# Patient Record
Sex: Female | Born: 1980 | Hispanic: No | Marital: Married | Smoking: Never smoker
Health system: Southern US, Community
[De-identification: ages and names within clinical notes are randomized; demographics above are authoritative.]

## PROBLEM LIST (undated history)

## (undated) ENCOUNTER — Inpatient Hospital Stay (HOSPITAL_COMMUNITY): Payer: Self-pay

## (undated) DIAGNOSIS — Z789 Other specified health status: Secondary | ICD-10-CM

## (undated) HISTORY — PX: THERAPEUTIC ABORTION: SHX798

## (undated) HISTORY — PX: NO PAST SURGERIES: SHX2092

---

## 2016-05-19 ENCOUNTER — Inpatient Hospital Stay (HOSPITAL_COMMUNITY)
Admission: AD | Admit: 2016-05-19 | Discharge: 2016-05-19 | Disposition: A | Discharge status: Home / Self Care | Payer: BLUE CROSS/BLUE SHIELD | Source: Ambulatory Visit | Attending: Obstetrics & Gynecology | Admitting: Obstetrics & Gynecology

## 2016-05-19 ENCOUNTER — Encounter (HOSPITAL_COMMUNITY): Payer: Self-pay | Admitting: *Deleted

## 2016-05-19 DIAGNOSIS — Z3A01 Less than 8 weeks gestation of pregnancy: Secondary | ICD-10-CM | POA: Insufficient documentation

## 2016-05-19 DIAGNOSIS — O219 Vomiting of pregnancy, unspecified: Secondary | ICD-10-CM | POA: Insufficient documentation

## 2016-05-19 DIAGNOSIS — O9989 Other specified diseases and conditions complicating pregnancy, childbirth and the puerperium: Secondary | ICD-10-CM

## 2016-05-19 DIAGNOSIS — K5900 Constipation, unspecified: Secondary | ICD-10-CM | POA: Insufficient documentation

## 2016-05-19 DIAGNOSIS — R109 Unspecified abdominal pain: Secondary | ICD-10-CM

## 2016-05-19 DIAGNOSIS — O26891 Other specified pregnancy related conditions, first trimester: Secondary | ICD-10-CM | POA: Diagnosis not present

## 2016-05-19 DIAGNOSIS — O26899 Other specified pregnancy related conditions, unspecified trimester: Secondary | ICD-10-CM

## 2016-05-19 HISTORY — DX: Other specified health status: Z78.9

## 2016-05-19 LAB — URINE MICROSCOPIC-ADD ON

## 2016-05-19 LAB — URINALYSIS, ROUTINE W REFLEX MICROSCOPIC
Bilirubin Urine: NEGATIVE
Glucose, UA: NEGATIVE mg/dL
Ketones, ur: NEGATIVE mg/dL
LEUKOCYTES UA: NEGATIVE
NITRITE: NEGATIVE
PROTEIN: NEGATIVE mg/dL
Specific Gravity, Urine: 1.01 (ref 1.005–1.030)
pH: 7.5 (ref 5.0–8.0)

## 2016-05-19 LAB — POCT PREGNANCY, URINE: PREG TEST UR: POSITIVE — AB

## 2016-05-19 NOTE — MAU Note (Signed)
C/o a "pulling" at her umbilicus for past 2 days; having some N&V; c/o dizziness for past week;

## 2016-05-19 NOTE — Discharge Instructions (Signed)
Safe Medications in Pregnancy   Acne: Benzoyl Peroxide Salicylic Acid  Backache/Headache: Tylenol: 2 regular strength every 4 hours OR              2 Extra strength every 6 hours  Colds/Coughs/Allergies: Benadryl (alcohol free) 25 mg every 6 hours as needed Breath right strips Claritin Cepacol throat lozenges Chloraseptic throat spray Cold-Eeze- up to three times per day Cough drops, alcohol free Flonase (by prescription only) Guaifenesin Mucinex Robitussin DM (plain only, alcohol free) Saline nasal spray/drops Sudafed (pseudoephedrine) & Actifed ** use only after [redacted] weeks gestation and if you do not have high blood pressure Tylenol Vicks Vaporub Zinc lozenges Zyrtec   Constipation: Colace Ducolax suppositories Fleet enema Glycerin suppositories Metamucil Milk of magnesia Miralax Senokot Smooth move tea  Diarrhea: Kaopectate Imodium A-D  *NO pepto Bismol  Hemorrhoids: Anusol Anusol HC Preparation H Tucks  Indigestion: Tums Maalox Mylanta Zantac  Pepcid  Insomnia: Benadryl (alcohol free) 25mg  every 6 hours as needed Tylenol PM Unisom, no Gelcaps  Leg Cramps: Tums MagGel  Nausea/Vomiting:  Bonine Dramamine Emetrol Ginger extract Sea bands Meclizine  Nausea medication to take during pregnancy:  Unisom (doxylamine succinate 25 mg tablets) Take one tablet daily at bedtime. If symptoms are not adequately controlled, the dose can be increased to a maximum recommended dose of two tablets daily (1/2 tablet in the morning, 1/2 tablet mid-afternoon and one at bedtime). Vitamin B6 100mg  tablets. Take one tablet twice a day (up to 200 mg per day).  Skin Rashes: Aveeno products Benadryl cream or 25mg  every 6 hours as needed Calamine Lotion 1% cortisone cream  Yeast infection: Gyne-lotrimin 7 Monistat 7   **If taking multiple medications, please check labels to avoid duplicating the same active ingredients **take medication as directed on  the label ** Do not exceed 4000 mg of tylenol in 24 hours **Do not take medications that contain aspirin or ibuprofen      Morning Sickness Morning sickness is when you feel sick to your stomach (nauseous) during pregnancy. This nauseous feeling may or may not come with vomiting. It often occurs in the morning but can be a problem any time of day. Morning sickness is most common during the first trimester, but it may continue throughout pregnancy. While morning sickness is unpleasant, it is usually harmless unless you develop severe and continual vomiting (hyperemesis gravidarum). This condition requires more intense treatment.  CAUSES  The cause of morning sickness is not completely known but seems to be related to normal hormonal changes that occur in pregnancy. RISK FACTORS You are at greater risk if you:  Experienced nausea or vomiting before your pregnancy.  Had morning sickness during a previous pregnancy.  Are pregnant with more than one baby, such as twins. TREATMENT  Do not use any medicines (prescription, over-the-counter, or herbal) for morning sickness without first talking to your health care provider. Your health care provider may prescribe or recommend:  Vitamin B6 supplements.  Anti-nausea medicines.  The herbal medicine ginger. HOME CARE INSTRUCTIONS   Only take over-the-counter or prescription medicines as directed by your health care provider.  Taking multivitamins before getting pregnant can prevent or decrease the severity of morning sickness in most women.  Eat a piece of dry toast or unsalted crackers before getting out of bed in the morning.  Eat five or six small meals a day.  Eat dry and bland foods (rice, baked potato). Foods high in carbohydrates are often helpful.  Do not drink liquids with  your meals. Drink liquids between meals.  Avoid greasy, fatty, and spicy foods.  Get someone to cook for you if the smell of any food causes nausea and  vomiting.  If you feel nauseous after taking prenatal vitamins, take the vitamins at night or with a snack.  Snack on protein foods (nuts, yogurt, cheese) between meals if you are hungry.  Eat unsweetened gelatins for desserts.  Wearing an acupressure wristband (worn for sea sickness) may be helpful.  Acupuncture may be helpful.  Do not smoke.  Get a humidifier to keep the air in your house free of odors.  Get plenty of fresh air. SEEK MEDICAL CARE IF:   Your home remedies are not working, and you need medicine.  You feel dizzy or lightheaded.  You are losing weight. SEEK IMMEDIATE MEDICAL CARE IF:   You have persistent and uncontrolled nausea and vomiting.  You pass out (faint). MAKE SURE YOU:  Understand these instructions.  Will watch your condition.  Will get help right away if you are not doing well or get worse.   This information is not intended to replace advice given to you by your health care provider. Make sure you discuss any questions you have with your health care provider.   Document Released: 11/11/2006 Document Revised: 09/25/2013 Document Reviewed: 03/07/2013 Elsevier Interactive Patient Education [employer]

## 2016-05-19 NOTE — MAU Provider Note (Signed)
History     CSN: [id number] Arrival date and time: 05/19/16 [id number] First Provider Initiated Contact with Patient 05/19/16 1950      Chief Complaint  Patient presents with  . Dizziness  . Abdominal Pain   HPI Patient is 35 y.o. [id number] 77106w2d here with complaints of dizziness/nausea, and pressure behind her navel. She reports this has been presenting for 2 days. She denies dysuria, polyuria. Reports constipation, no BM for 2 days.    Denies VB, cramping, vaginal discharge.   OB History    Gravida Para Term Preterm AB Living   3 2 2     2    SAB TAB Ectopic Multiple Live Births           2      Past Medical History:  Diagnosis Date  . Medical history non-contributory     Past Surgical History:  Procedure Laterality Date  . NO PAST SURGERIES      Family History  Problem Relation Age of Onset  . Alcohol abuse Neg Hx   . Arthritis Neg Hx   . Asthma Neg Hx   . Birth defects Neg Hx   . COPD Neg Hx   . Cancer Neg Hx   . Depression Neg Hx   . Diabetes Neg Hx   . Drug abuse Neg Hx   . Early death Neg Hx   . Hearing loss Neg Hx   . Heart disease Neg Hx   . Hyperlipidemia Neg Hx   . Hypertension Neg Hx   . Kidney disease Neg Hx   . Learning disabilities Neg Hx   . Mental illness Neg Hx   . Mental retardation Neg Hx   . Miscarriages / Stillbirths Neg Hx   . Stroke Neg Hx   . Vision loss Neg Hx   . Varicose Veins Neg Hx     Social History  Substance Use Topics  . Smoking status: Never Smoker  . Smokeless tobacco: Never Used  . Alcohol use No    Allergies: No Known Allergies  No prescriptions prior to admission.    Review of Systems  Constitutional: Negative for chills and fever.  Eyes: Negative for blurred vision and double vision.  Respiratory: Negative for cough and shortness of breath.   Cardiovascular: Negative for chest pain and orthopnea.  Gastrointestinal: Positive for nausea and vomiting.  Genitourinary: Negative for dysuria, flank pain and  frequency.  Musculoskeletal: Negative for myalgias.  Skin: Negative for rash.  Neurological: Positive for dizziness. Negative for tingling, weakness and headaches.  Endo/Heme/Allergies: Does not bruise/bleed easily.  Psychiatric/Behavioral: Negative for depression and suicidal ideas. The patient is not nervous/anxious.    Physical Exam   Blood pressure 112/68, pulse 69, temperature 99.1 F (37.3 C), temperature source Oral, resp. rate 16, last menstrual period 03/29/2016.  Physical Exam  Nursing note and vitals reviewed. Constitutional: She is oriented to person, place, and time. She appears well-developed and well-nourished. No distress.  HENT:  Head: Normocephalic and atraumatic.  Eyes: Conjunctivae are normal. No scleral icterus.  Neck: Normal range of motion. Neck supple.  Cardiovascular: Normal rate and intact distal pulses.   Respiratory: Effort normal. She exhibits no tenderness.  GI: Soft. She exhibits no distension. There is tenderness (mild discomfort with deep suprapubic pain). There is no rebound and no guarding.  Genitourinary: Vagina normal.  Musculoskeletal: Normal range of motion. She exhibits no edema.  Neurological: She is alert and oriented to person, place, and time.  Skin: Skin is  warm and dry. No rash noted.  Psychiatric: She has a normal mood and affect.    MAU Course  Procedures  MDM  Results for orders placed or performed during the hospital encounter of 05/19/16 (from the past 24 hour(s))  Urinalysis, Routine w reflex microscopic ([facility])     Status: Abnormal   Collection Time: 05/19/16  6:45 PM  Result Value Ref Range   Color, Urine YELLOW YELLOW   APPearance CLEAR CLEAR   Specific Gravity, Urine 1.010 1.005 - 1.030   pH 7.5 5.0 - 8.0   Glucose, UA NEGATIVE NEGATIVE mg/dL   Hgb urine dipstick SMALL (A) NEGATIVE   Bilirubin Urine NEGATIVE NEGATIVE   Ketones, ur NEGATIVE NEGATIVE mg/dL   Protein, ur NEGATIVE NEGATIVE mg/dL   Nitrite  NEGATIVE NEGATIVE   Leukocytes, UA NEGATIVE NEGATIVE  Urine microscopic-add on     Status: Abnormal   Collection Time: 05/19/16  6:45 PM  Result Value Ref Range   Squamous Epithelial / LPF 0-5 (A) NONE SEEN   WBC, UA 0-5 0 - 5 WBC/hpf   RBC / HPF 0-5 0 - 5 RBC/hpf   Bacteria, UA RARE (A) NONE SEEN  Pregnancy, urine POC     Status: Abnormal   Collection Time: 05/19/16  6:52 PM  Result Value Ref Range   Preg Test, Ur POSITIVE (A) NEGATIVE    Assessment and Plan   1. Nausea and vomiting in pregnancy - normal variant - gave OTC med list - discussed small frequent meals  2. Abdominal pain in pregnancy- concerning sx and benign exam - given first trimester return precuations  3. Pregnancy - Gave list of clinics - Needs to establish care  Federico FlakeKimberly Niles Rubylee Zamarripa 05/19/2016, 7:50 PM

## 2016-05-20 ENCOUNTER — Encounter (HOSPITAL_COMMUNITY): Payer: Self-pay

## 2016-06-08 ENCOUNTER — Ambulatory Visit (INDEPENDENT_AMBULATORY_CARE_PROVIDER_SITE_OTHER): Payer: BLUE CROSS/BLUE SHIELD | Admitting: Obstetrics and Gynecology

## 2016-06-08 ENCOUNTER — Encounter: Payer: Self-pay | Admitting: Obstetrics and Gynecology

## 2016-06-08 VITALS — BP 116/78 | HR 85 | Wt 134.0 lb

## 2016-06-08 DIAGNOSIS — O219 Vomiting of pregnancy, unspecified: Secondary | ICD-10-CM | POA: Diagnosis not present

## 2016-06-08 DIAGNOSIS — Z3490 Encounter for supervision of normal pregnancy, unspecified, unspecified trimester: Secondary | ICD-10-CM | POA: Insufficient documentation

## 2016-06-08 DIAGNOSIS — Z3491 Encounter for supervision of normal pregnancy, unspecified, first trimester: Secondary | ICD-10-CM

## 2016-06-08 MED ORDER — ONDANSETRON HCL 4 MG PO TABS: 4.0000 mg | ORAL_TABLET | Freq: Three times a day (TID) | ORAL | 0 refills | Status: DC | PRN

## 2016-06-08 MED ORDER — COMPLETENATE 29-1 MG PO CHEW: 1.0000 | CHEWABLE_TABLET | Freq: Every day | ORAL | 10 refills | Status: DC

## 2016-06-08 NOTE — Patient Instructions (Signed)

## 2016-06-08 NOTE — Addendum Note (Signed)
Addended by: Anell BarrHOWARD, Trynity Skousen L on: 06/08/2016 03:40 PM   Modules accepted: Orders

## 2016-06-08 NOTE — Progress Notes (Signed)
Subjective:  Michele Everett is a 35 y.o. G3P2002 at 2261w1d being seen today for initial prenatal care.She does report some N/V and is not currently taking any prenatal vitamins.  Her previous pregnancies were [place]com[place].   Patient reports no complaints.   . Vag. Bleeding: None.   . Denies leaking of fluid.   The following portions of the patient's history were reviewed and updated as appropriate: allergies, current medications, past family history, past medical history, past social history, past surgical history and problem list. Problem list updated.  Objective:   Vitals:   06/08/16 1442  BP: 116/78  Pulse: 85  Weight: 134 lb (60.8 kg)    Fetal Status: Fetal Heart Rate (bpm): 170         General:  Alert, oriented and cooperative. Patient is in no acute distress.  Skin: Skin is warm and dry. No rash noted.   Cardiovascular: Normal heart rate noted  Respiratory: Normal respiratory effort, no problems with respiration noted  Abdomen: Soft, gravid, appropriate for gestational age. Pain/Pressure: Present     Pelvic:  Cervical exam performed        Extremities: Normal range of motion.  Edema: None  Mental Status: Normal mood and affect. Normal behavior. Normal judgment and thought content.   Urinalysis: Urine Protein: Trace Urine Glucose: Negative  Assessment and Plan:  Pregnancy: G3P2002 at 10161w1d  1. Prenatal care, first trimester  - Pap IG w/ reflex to HPV when ASC-U - HIV antibody (with reflex) - Prenatal Profile I - CULTURE, URINE COMPREHENSIVE - Opiate, quantitative, urine - GC/Chlamydia Probe Amp  Preterm labor symptoms and general obstetric precautions including but not limited to vaginal bleeding, contractions, leaking of fluid and fetal movement were reviewed in detail with the patient. Please refer to After Visit Summary for other counseling recommendations.  No Follow-up on file.   Hermina StaggersMichael L Martia Dalby, MD

## 2016-06-09 LAB — PRENATAL PROFILE I(LABCORP)
ANTIBODY SCREEN: NEGATIVE
Basophils Absolute: 0 10*3/uL (ref 0.0–0.2)
Basos: 0 %
EOS (ABSOLUTE): 0.2 10*3/uL (ref 0.0–0.4)
Eos: 2 %
HEMOGLOBIN: 12 g/dL (ref 11.1–15.9)
HEP B S AG: NEGATIVE
Hematocrit: 37.5 % (ref 34.0–46.6)
IMMATURE GRANULOCYTES: 0 %
Immature Grans (Abs): 0 10*3/uL (ref 0.0–0.1)
LYMPHS ABS: 1.6 10*3/uL (ref 0.7–3.1)
Lymphs: 20 %
MCH: 29.3 pg (ref 26.6–33.0)
MCHC: 32 g/dL (ref 31.5–35.7)
MCV: 92 fL (ref 79–97)
MONOS ABS: 0.6 10*3/uL (ref 0.1–0.9)
Monocytes: 8 %
NEUTROS PCT: 70 %
Neutrophils Absolute: 5.7 10*3/uL (ref 1.4–7.0)
Platelets: 300 10*3/uL (ref 150–379)
RBC: 4.09 x10E6/uL (ref 3.77–5.28)
RDW: 13.5 % (ref 12.3–15.4)
RPR: NONREACTIVE
RUBELLA: 12.1 {index} (ref >0.99)
Rh Factor: POSITIVE
WBC: 8.1 10*3/uL (ref 3.4–10.8)

## 2016-06-09 LAB — GC/CHLAMYDIA PROBE AMP
Chlamydia trachomatis, NAA: NEGATIVE
Neisseria gonorrhoeae by PCR: NEGATIVE

## 2016-06-09 LAB — HIV ANTIBODY (ROUTINE TESTING W REFLEX): HIV Screen 4th Generation wRfx: NONREACTIVE

## 2016-06-10 ENCOUNTER — Ambulatory Visit (INDEPENDENT_AMBULATORY_CARE_PROVIDER_SITE_OTHER): Payer: BLUE CROSS/BLUE SHIELD

## 2016-06-10 ENCOUNTER — Telehealth: Payer: Self-pay | Admitting: Obstetrics and Gynecology

## 2016-06-10 DIAGNOSIS — O3680X1 Pregnancy with inconclusive fetal viability, fetus 1: Secondary | ICD-10-CM | POA: Diagnosis not present

## 2016-06-10 DIAGNOSIS — Z3491 Encounter for supervision of normal pregnancy, unspecified, first trimester: Secondary | ICD-10-CM

## 2016-06-10 NOTE — Telephone Encounter (Signed)
Pt stated that the meds that were prescribed to her the other day are not covered by [facility] per the pharmacy. Pt needs something else to be written and would like to be notified when new rx is written. Please advise.

## 2016-06-11 LAB — CULTURE, URINE COMPREHENSIVE

## 2016-06-11 NOTE — Telephone Encounter (Signed)
I spoke w/ [facility]. They stated [facility] would not cover due to issue w/ prescribing physicians [facility].  I changed prescriber to Dr Clearance CootsHarper and rx went through. Pt was advised and voiced understanding.

## 2016-06-14 LAB — OPIATE, QUANTITATIVE, URINE
OXYCODONE+OXYMORPHONE UR QL SCN: NEGATIVE
Opiates: NEGATIVE

## 2016-06-17 ENCOUNTER — Telehealth: Payer: Self-pay | Admitting: *Deleted

## 2016-06-17 NOTE — Telephone Encounter (Signed)
Patient aware of lab result and medication called to [facility] [address] [phone].Patient aware needs to drink at least 6-8 8 oz glasses of water a day.

## 2016-06-18 LAB — PAP IG W/ RFLX HPV ASCU: PAP Smear Comment: 0

## 2016-06-18 LAB — HPV, LOW VOLUME (REFLEX): HPV low volume reflex: NEGATIVE

## 2016-06-18 LAB — HPV DNA PROBE HIGH RISK, AMPLIFIED

## 2016-06-23 ENCOUNTER — Telehealth: Payer: Self-pay | Admitting: *Deleted

## 2016-06-23 DIAGNOSIS — O2342 Unspecified infection of urinary tract in pregnancy, second trimester: Secondary | ICD-10-CM

## 2016-06-23 MED ORDER — AMOXICILLIN 500 MG PO CAPS: 500.0000 mg | ORAL_CAPSULE | Freq: Three times a day (TID) | ORAL | 0 refills | Status: AC

## 2016-06-23 NOTE — Telephone Encounter (Signed)
Attempted to call patient regarding uti, antibiotic prescription has been sent to her pharmacy. There was no answer and her voice mail box was full. Will try to contact again later.

## 2016-06-23 NOTE — Telephone Encounter (Signed)
-----   Message from Hermina StaggersMichael L Ervin, MD sent at 06/21/2016  8:19 AM EDT ----- Please treat pt's UTI with Amoxil 500 mg tid x 7 days Will follow up on pap post partum Thanks Casimiro NeedleMichael ----- Message ----- From: Interface, [facility] Lab Results In Sent: 06/09/2016   7:43 AM To: Hermina StaggersMichael L Ervin, MD

## 2016-06-28 NOTE — Telephone Encounter (Signed)
No answer or voicemail to leave a message. 

## 2016-07-02 NOTE — Telephone Encounter (Signed)
Make sure patient was treated for UTI appointment scheduled for 08/02/2016

## 2016-07-06 ENCOUNTER — Encounter: Payer: BLUE CROSS/BLUE SHIELD | Admitting: Obstetrics and Gynecology

## 2016-07-07 ENCOUNTER — Ambulatory Visit (INDEPENDENT_AMBULATORY_CARE_PROVIDER_SITE_OTHER): Payer: BLUE CROSS/BLUE SHIELD | Admitting: Certified Nurse Midwife

## 2016-07-07 VITALS — BP 95/60 | HR 78 | Temp 98.6°F | Wt 138.0 lb

## 2016-07-07 DIAGNOSIS — Z3482 Encounter for supervision of other normal pregnancy, second trimester: Secondary | ICD-10-CM

## 2016-07-07 DIAGNOSIS — Z3480 Encounter for supervision of other normal pregnancy, unspecified trimester: Secondary | ICD-10-CM | POA: Insufficient documentation

## 2016-07-07 NOTE — Progress Notes (Signed)
Patient c/o a little vaginal bleeding for about 2-3 days

## 2016-07-07 NOTE — Progress Notes (Signed)
  Subjective:    Michele Everett is a 35 y.o. female being seen today for her obstetrical visit. She is at 382w3d gestation. Patient reports: vaginal bleeding off and on for over a month, stopped for a few days has been bleeding for 2-3 days now.  Last reported sexual intercourse was 2 days ago during bleeding.  All brownish bleeding, denies any rubera.  Did not have vaginal bleeding with other pregnancies.  Does have lower abdominal cramping that is coming and going.  States that she is employed but sits most of the time.  Problem List Items Addressed This Visit      Other   Supervision of other normal pregnancy, antepartum    Other Visit Diagnoses    Encounter for supervision of other normal pregnancy in second trimester    -  Primary   Relevant Orders   US OB Comp + 14 Wk   MaterniT21 PLUS Core+SCA     Patient Active Problem List   Diagnosis Date Noted  . Supervision of other normal pregnancy, antepartum 07/07/2016  . Prenatal care 06/08/2016  . Nausea and vomiting in pregnancy 06/08/2016    Objective:     BP 95/60   Pulse 78   Temp 98.6 F (37 C)   Wt 138 lb (62.6 kg)   LMP 03/29/2016  Uterine Size: Below umbilicus   FHR: 151 by doppler @ midline pubic bone  Cervix: long, thick, closed and posterior.  +brown discharge present in vaginal vault. Negative CMT.   Assessment:    Pregnancy @ 5282w3d  weeks Vaginal bleeding in second trimester of pregnancy    Plan:   To MAU for evaluation of vaginal bleeding.    Problem list reviewed and updated. Labs reviewed.  Follow up in 4 weeks. FIRST/CF mutation testing/NIPT/QUAD SCREEN/fragile X/Ashkenazi Jewish population testing/Spinal muscular atrophy discussed: ordered. Role of ultrasound in pregnancy discussed; fetal survey: ordered. Amniocentesis discussed: not indicated. 50% of 20 minute visit spent on counseling and coordination of care.

## 2016-07-07 NOTE — Patient Instructions (Signed)

## 2016-07-14 LAB — MATERNIT21 PLUS CORE+SCA
CHROMOSOME 18: NEGATIVE
CHROMOSOME 21: NEGATIVE
Chromosome 13: NEGATIVE
PDF: 0
Y Chromosome: DETECTED

## 2016-07-16 ENCOUNTER — Other Ambulatory Visit: Payer: Self-pay | Admitting: Certified Nurse Midwife

## 2016-07-16 DIAGNOSIS — Z3480 Encounter for supervision of other normal pregnancy, unspecified trimester: Secondary | ICD-10-CM

## 2016-07-29 ENCOUNTER — Ambulatory Visit (INDEPENDENT_AMBULATORY_CARE_PROVIDER_SITE_OTHER): Payer: BLUE CROSS/BLUE SHIELD | Admitting: Advanced Practice Midwife

## 2016-07-29 ENCOUNTER — Ambulatory Visit: Payer: Self-pay

## 2016-07-29 VITALS — BP 99/60 | HR 77 | Temp 99.3°F | Wt 140.0 lb

## 2016-07-29 DIAGNOSIS — Z3A20 20 weeks gestation of pregnancy: Secondary | ICD-10-CM

## 2016-07-29 DIAGNOSIS — Z363 Encounter for antenatal screening for malformations: Secondary | ICD-10-CM

## 2016-07-29 DIAGNOSIS — Z3482 Encounter for supervision of other normal pregnancy, second trimester: Secondary | ICD-10-CM

## 2016-07-29 DIAGNOSIS — Z3480 Encounter for supervision of other normal pregnancy, unspecified trimester: Secondary | ICD-10-CM

## 2016-07-29 DIAGNOSIS — O219 Vomiting of pregnancy, unspecified: Secondary | ICD-10-CM

## 2016-07-29 NOTE — Progress Notes (Signed)
   PRENATAL VISIT NOTE  Subjective:  Michele Everett is a 35 y.o. G3P2002 at 3339w4d being seen today for ongoing prenatal care.  She is currently monitored for the following issues for this low-risk pregnancy and has Prenatal care; Nausea and vomiting in pregnancy; and Supervision of other normal pregnancy, antepartum on her problem list.  Patient reports no complaints.  Contractions: Not present. Vag. Bleeding: None.  Movement: Present. Denies leaking of fluid.   The following portions of the patient's history were reviewed and updated as appropriate: allergies, current medications, past family history, past medical history, past social history, past surgical history and problem list. Problem list updated.  Missed anatomy scan today.   Objective:   Vitals:   07/29/16 1630  BP: 99/60  Pulse: 77  Temp: 99.3 F (37.4 C)  Weight: 140 lb (63.5 kg)    Fetal Status: Fetal Heart Rate (bpm): 148 Fundal Height: 20 cm Movement: Present     General:  Alert, oriented and cooperative. Patient is in no acute distress.  Skin: Skin is warm and dry. No rash noted.   Cardiovascular: Normal heart rate noted  Respiratory: Normal respiratory effort, no problems with respiration noted  Abdomen: Soft, gravid, appropriate for gestational age. Pain/Pressure: Present     Pelvic:  Cervical exam deferred        Extremities: Normal range of motion.  Edema: None  Mental Status: Normal mood and affect. Normal behavior. Normal judgment and thought content.   Assessment and Plan:  Pregnancy: G3P2002 at 7539w4d  1. Supervision of other normal pregnancy, antepartum   2. [redacted] weeks gestation of pregnancy   3. Encounter for antenatal screening for malformation using ultrasound   4. Nausea and vomiting in pregnancy   Preterm labor symptoms and general obstetric precautions including but not limited to vaginal bleeding, contractions, leaking of fluid and fetal movement were reviewed in detail with the  patient. Please refer to After Visit Summary for other counseling recommendations.  Return in about 4 weeks (around 08/26/2016) for ROB.  Dorathy KinsmanVirginia Vernee Baines, CNM

## 2016-07-29 NOTE — Patient Instructions (Signed)

## 2016-07-29 NOTE — Progress Notes (Signed)
Patient is doing well- she missed her US appointment today and needs to reschedule

## 2016-08-06 ENCOUNTER — Ambulatory Visit (HOSPITAL_COMMUNITY)
Admission: RE | Admit: 2016-08-06 | Discharge: 2016-08-06 | Disposition: A | Discharge status: Home / Self Care | Payer: Medicaid Other | Source: Ambulatory Visit | Attending: Advanced Practice Midwife | Admitting: Advanced Practice Midwife

## 2016-08-06 DIAGNOSIS — Z363 Encounter for antenatal screening for malformations: Secondary | ICD-10-CM | POA: Diagnosis not present

## 2016-08-06 DIAGNOSIS — Z3A18 18 weeks gestation of pregnancy: Secondary | ICD-10-CM | POA: Diagnosis not present

## 2016-08-06 DIAGNOSIS — Z3A20 20 weeks gestation of pregnancy: Secondary | ICD-10-CM

## 2016-08-06 DIAGNOSIS — Z3482 Encounter for supervision of other normal pregnancy, second trimester: Secondary | ICD-10-CM | POA: Diagnosis not present

## 2016-08-06 DIAGNOSIS — Z3480 Encounter for supervision of other normal pregnancy, unspecified trimester: Secondary | ICD-10-CM

## 2016-08-11 ENCOUNTER — Encounter: Payer: Self-pay | Admitting: Advanced Practice Midwife

## 2016-08-11 DIAGNOSIS — O3503X0 Maternal care for (suspected) central nervous system malformation or damage in fetus, choroid plexus cysts, not applicable or unspecified: Secondary | ICD-10-CM | POA: Insufficient documentation

## 2016-08-11 DIAGNOSIS — O350XX0 Maternal care for (suspected) central nervous system malformation in fetus, not applicable or unspecified: Secondary | ICD-10-CM | POA: Insufficient documentation

## 2016-08-25 ENCOUNTER — Encounter: Payer: Self-pay | Admitting: Obstetrics and Gynecology

## 2016-09-30 ENCOUNTER — Encounter: Payer: Self-pay | Admitting: Certified Nurse Midwife

## 2016-09-30 ENCOUNTER — Ambulatory Visit (INDEPENDENT_AMBULATORY_CARE_PROVIDER_SITE_OTHER): Payer: BLUE CROSS/BLUE SHIELD | Admitting: Certified Nurse Midwife

## 2016-09-30 DIAGNOSIS — Z3482 Encounter for supervision of other normal pregnancy, second trimester: Secondary | ICD-10-CM

## 2016-09-30 DIAGNOSIS — Z3480 Encounter for supervision of other normal pregnancy, unspecified trimester: Secondary | ICD-10-CM

## 2016-09-30 NOTE — Progress Notes (Signed)
Pt denies flu vaccine today.

## 2016-09-30 NOTE — Progress Notes (Signed)
Subjective:    Michele Everett is a 35 y.o. female being seen today for her obstetrical visit. She is at 6421w4d gestation. Patient reports: no complaints . Reports insomnia: discussed OTC medications-Benadryl/Unisom.  Fetal movement: normal.  Problem List Items Addressed This Visit      Other   Supervision of other normal pregnancy, antepartum   Relevant Orders   US MFM OB FOLLOW UP     Patient Active Problem List   Diagnosis Date Noted  . Choroid plexus cyst of fetus affecting care of mother, antepartum 08/11/2016  . Supervision of other normal pregnancy, antepartum 07/07/2016  . Prenatal care 06/08/2016  . Nausea and vomiting in pregnancy 06/08/2016   Objective:    BP 105/71   Pulse 98   Wt 151 lb (68.5 kg)   LMP 03/29/2016  FHT: 138 BPM  Uterine Size: 27 cm and size equals dates     Assessment:    Pregnancy @ 5621w4d    Insomnia  Language barrier  Plan:   Will schedule 2 hour OGTT for Tuesday.   OBGCT: discussed and ordered for next visit. Signs and symptoms of preterm labor: discussed.  Labs, problem list reviewed and updated 2 hr GTT planned Follow up in 2 weeks.

## 2016-10-04 NOTE — L&D Delivery Note (Signed)
Delivery Note Patient presented in spontaneous active labor.  She progressed quickly.  At 8:41 AM a viable female was delivered via  (Presentation: LOA).  APGAR: 9, 9; weight pending.   Placenta status: spontaneous, intact, active management of 3rd stage.  Cord: 3VC with the following complications: loosely wrapped around right leg.  Anesthesia:  none Episiotomy:  none Lacerations:  none Suture Repair: n/a Est. Blood Loss (mL): 100    Mom to postpartum.  Baby to Couplet care / Skin to Skin.  Charlsie MerlesJulia Rhoden 12/25/2016, 8:52 AM   OB FELLOW DELIVERY ATTESTATION  I was gloved and present for the delivery in its entirety, and I agree with the above resident's note.    Jen MowElizabeth Korban Shearer, DO [place] Fellow 12/25/2016

## 2016-10-07 ENCOUNTER — Other Ambulatory Visit: Payer: BLUE CROSS/BLUE SHIELD

## 2016-10-07 DIAGNOSIS — Z3403 Encounter for supervision of normal first pregnancy, third trimester: Secondary | ICD-10-CM

## 2016-10-08 ENCOUNTER — Other Ambulatory Visit: Payer: Self-pay | Admitting: Certified Nurse Midwife

## 2016-10-08 ENCOUNTER — Ambulatory Visit (HOSPITAL_COMMUNITY)
Admission: RE | Admit: 2016-10-08 | Discharge: 2016-10-08 | Disposition: A | Discharge status: Home / Self Care | Payer: Medicaid Other | Source: Ambulatory Visit | Attending: Certified Nurse Midwife | Admitting: Certified Nurse Midwife

## 2016-10-08 DIAGNOSIS — Z3480 Encounter for supervision of other normal pregnancy, unspecified trimester: Secondary | ICD-10-CM

## 2016-10-08 DIAGNOSIS — Z362 Encounter for other antenatal screening follow-up: Secondary | ICD-10-CM

## 2016-10-08 DIAGNOSIS — Z3A27 27 weeks gestation of pregnancy: Secondary | ICD-10-CM

## 2016-10-08 DIAGNOSIS — Z3490 Encounter for supervision of normal pregnancy, unspecified, unspecified trimester: Secondary | ICD-10-CM | POA: Diagnosis present

## 2016-10-08 LAB — GLUCOSE TOLERANCE, 2 HOURS W/ 1HR
GLUCOSE, 1 HOUR: 110 mg/dL (ref 65–179)
GLUCOSE, FASTING: 78 mg/dL (ref 65–91)
Glucose, 2 hour: 85 mg/dL (ref 65–152)

## 2016-10-08 LAB — CBC
Hematocrit: 35.6 % (ref 34.0–46.6)
Hemoglobin: 11.4 g/dL (ref 11.1–15.9)
MCH: 29.1 pg (ref 26.6–33.0)
MCHC: 32 g/dL (ref 31.5–35.7)
MCV: 91 fL (ref 79–97)
PLATELETS: 304 10*3/uL (ref 150–379)
RBC: 3.92 x10E6/uL (ref 3.77–5.28)
RDW: 14.1 % (ref 12.3–15.4)
WBC: 7.9 10*3/uL (ref 3.4–10.8)

## 2016-10-08 LAB — HIV ANTIBODY (ROUTINE TESTING W REFLEX): HIV SCREEN 4TH GENERATION: NONREACTIVE

## 2016-10-08 LAB — RPR: RPR: NONREACTIVE

## 2016-10-11 ENCOUNTER — Other Ambulatory Visit: Payer: Self-pay | Admitting: Certified Nurse Midwife

## 2016-10-11 DIAGNOSIS — Z3480 Encounter for supervision of other normal pregnancy, unspecified trimester: Secondary | ICD-10-CM

## 2016-10-14 ENCOUNTER — Other Ambulatory Visit: Payer: Self-pay | Admitting: Certified Nurse Midwife

## 2016-10-18 ENCOUNTER — Ambulatory Visit (INDEPENDENT_AMBULATORY_CARE_PROVIDER_SITE_OTHER): Payer: Medicaid Other | Admitting: Certified Nurse Midwife

## 2016-10-18 DIAGNOSIS — Z3483 Encounter for supervision of other normal pregnancy, third trimester: Secondary | ICD-10-CM

## 2016-10-18 DIAGNOSIS — Z3480 Encounter for supervision of other normal pregnancy, unspecified trimester: Secondary | ICD-10-CM

## 2016-10-18 NOTE — Progress Notes (Signed)
   PRENATAL VISIT NOTE  Subjective:  Michele Everett is a 36 y.o. G3P2002 at 3271w1d being seen today for ongoing prenatal care.  She is currently monitored for the following issues for this low-risk pregnancy and has Prenatal care; Supervision of other normal pregnancy, antepartum; and Choroid plexus cyst of fetus affecting care of mother, antepartum on her problem list.  Patient reports no complaints.  Contractions: Not present. Vag. Bleeding: None.  Movement: Present. Denies leaking of fluid.   The following portions of the patient's history were reviewed and updated as appropriate: allergies, current medications, past family history, past medical history, past social history, past surgical history and problem list. Problem list updated.  Objective:   Vitals:   10/18/16 0900  BP: 100/65  Pulse: 85  Temp: 98.6 F (37 C)  Weight: 152 lb 3.2 oz (69 kg)    Fetal Status: Fetal Heart Rate (bpm): 144 Fundal Height: 30 cm Movement: Present     General:  Alert, oriented and cooperative. Patient is in no acute distress.  Skin: Skin is warm and dry. No rash noted.   Cardiovascular: Normal heart rate noted  Respiratory: Normal respiratory effort, no problems with respiration noted  Abdomen: Soft, gravid, appropriate for gestational age. Pain/Pressure: Absent     Pelvic:  Cervical exam deferred        Extremities: Normal range of motion.  Edema: None  Mental Status: Normal mood and affect. Normal behavior. Normal judgment and thought content.   Assessment and Plan:  Pregnancy: G3P2002 at 8371w1d  1. Supervision of other normal pregnancy, antepartum     Aeroflow information given.  [facility].  Vitamin samples given.  Preterm labor symptoms and general obstetric precautions including but not limited to vaginal bleeding, contractions, leaking of fluid and fetal movement were reviewed in detail with the patient. Please refer to After Visit Summary for other counseling  recommendations.  Return in about 2 weeks (around 11/01/2016) for ROB.   Roe Coombsachelle A Ova Meegan, CNM

## 2016-11-01 ENCOUNTER — Ambulatory Visit (INDEPENDENT_AMBULATORY_CARE_PROVIDER_SITE_OTHER): Payer: Medicaid Other | Admitting: Obstetrics and Gynecology

## 2016-11-01 VITALS — BP 101/64 | HR 96 | Temp 97.6°F | Wt 155.2 lb

## 2016-11-01 DIAGNOSIS — O350XX0 Maternal care for (suspected) central nervous system malformation in fetus, not applicable or unspecified: Secondary | ICD-10-CM

## 2016-11-01 DIAGNOSIS — O3503X0 Maternal care for (suspected) central nervous system malformation or damage in fetus, choroid plexus cysts, not applicable or unspecified: Secondary | ICD-10-CM

## 2016-11-01 DIAGNOSIS — Z3480 Encounter for supervision of other normal pregnancy, unspecified trimester: Secondary | ICD-10-CM

## 2016-11-01 DIAGNOSIS — O358XX0 Maternal care for other (suspected) fetal abnormality and damage, not applicable or unspecified: Secondary | ICD-10-CM

## 2016-11-01 NOTE — Progress Notes (Signed)
Patient is in the office for ob visit, reports good fetal movement. 

## 2016-11-01 NOTE — Patient Instructions (Signed)
Third Trimester of Pregnancy The third trimester is from week 29 through week 40 (months 7 through 9). The third trimester is a time when the unborn baby (fetus) is growing rapidly. At the end of the ninth month, the fetus is about 20 inches in length and weighs 6-10 pounds. Body changes during your third trimester Your body goes through many changes during pregnancy. The changes vary from woman to woman. During the third trimester:  Your weight will continue to increase. You can expect to gain 25-35 pounds (11-16 kg) by the end of the pregnancy.  You may begin to get stretch marks on your hips, abdomen, and breasts.  You may urinate more often because the fetus is moving lower into your pelvis and pressing on your bladder.  You may develop or continue to have heartburn. This is caused by increased hormones that slow down muscles in the digestive tract.  You may develop or continue to have constipation because increased hormones slow digestion and cause the muscles that push waste through your intestines to relax.  You may develop hemorrhoids. These are swollen veins (varicose veins) in the rectum that can itch or be painful.  You may develop swollen, bulging veins (varicose veins) in your legs.  You may have increased body aches in the pelvis, back, or thighs. This is due to weight gain and increased hormones that are relaxing your joints.  You may have changes in your hair. These can include thickening of your hair, rapid growth, and changes in texture. Some women also have hair loss during or after pregnancy, or hair that feels dry or thin. Your hair will most likely return to normal after your baby is born.  Your breasts will continue to grow and they will continue to become tender. A yellow fluid (colostrum) may leak from your breasts. This is the first milk you are producing for your baby.  Your belly button may stick out.  You may notice more swelling in your hands, face, or  ankles.  You may have increased tingling or numbness in your hands, arms, and legs. The skin on your belly may also feel numb.  You may feel short of breath because of your expanding uterus.  You may have more problems sleeping. This can be caused by the size of your belly, increased need to urinate, and an increase in your body's metabolism.  You may notice the fetus "dropping," or moving lower in your abdomen.  You may have increased vaginal discharge.  Your cervix becomes thin and soft (effaced) near your due date. What to expect at prenatal visits You will have prenatal exams every 2 weeks until week 36. Then you will have weekly prenatal exams. During a routine prenatal visit:  You will be weighed to make sure you and the fetus are growing normally.  Your blood pressure will be taken.  Your abdomen will be measured to track your baby's growth.  The fetal heartbeat will be listened to.  Any test results from the previous visit will be discussed.  You may have a cervical check near your due date to see if you have effaced. At around 36 weeks, your health care provider will check your cervix. At the same time, your health care provider will also perform a test on the secretions of the vaginal tissue. This test is to determine if a type of bacteria, Group B streptococcus, is present. Your health care provider will explain this further. Your health care provider may ask you:    What your birth plan is.  How you are feeling.  If you are feeling the baby move.  If you have had any abnormal symptoms, such as leaking fluid, bleeding, severe headaches, or abdominal cramping.  If you are using any tobacco products, including cigarettes, chewing tobacco, and electronic cigarettes.  If you have any questions. Other tests or screenings that may be performed during your third trimester include:  Blood tests that check for low iron levels (anemia).  Fetal testing to check the health,  activity level, and growth of the fetus. Testing is done if you have certain medical conditions or if there are problems during the pregnancy.  Nonstress test (NST). This test checks the health of your baby to make sure there are no signs of problems, such as the baby not getting enough oxygen. During this test, a belt is placed around your belly. The baby is made to move, and its heart rate is monitored during movement. What is false labor? False labor is a condition in which you feel small, irregular tightenings of the muscles in the womb (contractions) that eventually go away. These are called Braxton Hicks contractions. Contractions may last for hours, days, or even weeks before true labor sets in. If contractions come at regular intervals, become more frequent, increase in intensity, or become painful, you should see your health care provider. What are the signs of labor?  Abdominal cramps.  Regular contractions that start at 10 minutes apart and become stronger and more frequent with time.  Contractions that start on the top of the uterus and spread down to the lower abdomen and back.  Increased pelvic pressure and dull back pain.  A watery or bloody mucus discharge that comes from the vagina.  Leaking of amniotic fluid. This is also known as your "water breaking." It could be a slow trickle or a gush. Let your doctor know if it has a color or strange odor. If you have any of these signs, call your health care provider right away, even if it is before your due date. Follow these instructions at home: Eating and drinking  Continue to eat regular, healthy meals.  Do not eat:  Raw meat or meat spreads.  Unpasteurized milk or cheese.  Unpasteurized juice.  Store-made salad.  Refrigerated smoked seafood.  Hot dogs or deli meat, unless they are piping hot.  More than 6 ounces of albacore tuna a week.  Shark, swordfish, king mackerel, or tile fish.  Store-made salads.  Raw  sprouts, such as mung bean or alfalfa sprouts.  Take prenatal vitamins as told by your health care provider.  Take 1000 mg of calcium daily as told by your health care provider.  If you develop constipation:  Take over-the-counter or prescription medicines.  Drink enough fluid to keep your urine clear or pale yellow.  Eat foods that are high in fiber, such as fresh fruits and vegetables, whole grains, and beans.  Limit foods that are high in fat and processed sugars, such as fried and sweet foods. Activity  Exercise only as directed by your health care provider. Healthy pregnant women should aim for 2 hours and 30 minutes of moderate exercise per week. If you experience any pain or discomfort while exercising, stop.  Avoid heavy lifting.  Do not exercise in extreme heat or humidity, or at high altitudes.  Wear low-heel, comfortable shoes.  Practice good posture.  Do not travel far distances unless it is absolutely necessary and only with the approval   of your health care provider.  Wear your seat belt at all times while in a car, on a bus, or on a plane.  Take frequent breaks and rest with your legs elevated if you have leg cramps or low back pain.  Do not use hot tubs, steam rooms, or saunas.  You may continue to have sex unless your health care provider tells you otherwise. Lifestyle  Do not use any products that contain nicotine or tobacco, such as cigarettes and e-cigarettes. If you need help quitting, ask your health care provider.  Do not drink alcohol.  Do not use any medicinal herbs or unprescribed drugs. These chemicals affect the formation and growth of the baby.  If you develop varicose veins:  Wear support pantyhose or compression stockings as told by your healthcare provider.  Elevate your feet for 15 minutes, 3-4 times a day.  Wear a supportive maternity bra to help with breast tenderness. General instructions  Take over-the-counter and prescription  medicines only as told by your health care provider. There are medicines that are either safe or unsafe to take during pregnancy.  Take warm sitz baths to soothe any pain or discomfort caused by hemorrhoids. Use hemorrhoid cream or witch hazel if your health care provider approves.  Avoid cat litter boxes and soil used by cats. These carry germs that can cause birth defects in the baby. If you have a cat, ask someone to clean the litter box for you.  To prepare for the arrival of your baby:  Take prenatal classes to understand, practice, and ask questions about the labor and delivery.  Make a trial run to the hospital.  Visit the hospital and tour the maternity area.  Arrange for maternity or paternity leave through employers.  Arrange for family and friends to take care of pets while you are in the hospital.  Purchase a rear-facing car seat and make sure you know how to install it in your car.  Pack your hospital bag.  Prepare the baby's nursery. Make sure to remove all pillows and stuffed animals from the baby's crib to prevent suffocation.  Visit your dentist if you have not gone during your pregnancy. Use a soft toothbrush to brush your teeth and be gentle when you floss.  Keep all prenatal follow-up visits as told by your health care provider. This is important. Contact a health care provider if:  You are unsure if you are in labor or if your water has broken.  You become dizzy.  You have mild pelvic cramps, pelvic pressure, or nagging pain in your abdominal area.  You have lower back pain.  You have persistent nausea, vomiting, or diarrhea.  You have an unusual or bad smelling vaginal discharge.  You have pain when you urinate. Get help right away if:  You have a fever.  You are leaking fluid from your vagina.  You have spotting or bleeding from your vagina.  You have severe abdominal pain or cramping.  You have rapid weight loss or weight gain.  You have  shortness of breath with chest pain.  You notice sudden or extreme swelling of your face, hands, ankles, feet, or legs.  Your baby makes fewer than 10 movements in 2 hours.  You have severe headaches that do not go away with medicine.  You have vision changes. Summary  The third trimester is from week 29 through week 40, months 7 through 9. The third trimester is a time when the unborn baby (fetus)   is growing rapidly.  During the third trimester, your discomfort may increase as you and your baby continue to gain weight. You may have abdominal, leg, and back pain, sleeping problems, and an increased need to urinate.  During the third trimester your breasts will keep growing and they will continue to become tender. A yellow fluid (colostrum) may leak from your breasts. This is the first milk you are producing for your baby.  False labor is a condition in which you feel small, irregular tightenings of the muscles in the womb (contractions) that eventually go away. These are called Braxton Hicks contractions. Contractions may last for hours, days, or even weeks before true labor sets in.  Signs of labor can include: abdominal cramps; regular contractions that start at 10 minutes apart and become stronger and more frequent with time; watery or bloody mucus discharge that comes from the vagina; increased pelvic pressure and dull back pain; and leaking of amniotic fluid. This information is not intended to replace advice given to you by your health care provider. Make sure you discuss any questions you have with your health care provider. Document Released: 09/14/2001 Document Revised: 02/26/2016 Document Reviewed: 11/21/2012 Elsevier Interactive Patient Education  2017 [employer]  

## 2016-11-01 NOTE — Progress Notes (Signed)
   PRENATAL VISIT NOTE  Subjective:  Michele Everett is a 36 y.o. G3P2002 at 6024w1d being seen today for ongoing prenatal care.  She is currently monitored for the following issues for this low-risk pregnancy and has Prenatal care; Supervision of other normal pregnancy, antepartum; and Choroid plexus cyst of fetus affecting care of mother, antepartum on her problem list.  Patient reports no complaints.  Contractions: Not present. Vag. Bleeding: None.  Movement: Present. Denies leaking of fluid.   The following portions of the patient's history were reviewed and updated as appropriate: allergies, current medications, past family history, past medical history, past social history, past surgical history and problem list. Problem list updated.  Objective:   Vitals:   11/01/16 1025  BP: 101/64  Pulse: 96  Temp: 97.6 F (36.4 C)  Weight: 155 lb 3.2 oz (70.4 kg)    Fetal Status: Fetal Heart Rate (bpm): 140   Movement: Present     General:  Alert, oriented and cooperative. Patient is in no acute distress.  Skin: Skin is warm and dry. No rash noted.   Cardiovascular: Normal heart rate noted  Respiratory: Normal respiratory effort, no problems with respiration noted  Abdomen: Soft, gravid, appropriate for gestational age. Pain/Pressure: Present     Pelvic:  Cervical exam deferred        Extremities: Normal range of motion.  Edema: None  Mental Status: Normal mood and affect. Normal behavior. Normal judgment and thought content.   Assessment and Plan:  Pregnancy: G3P2002 at 4824w1d  1. Supervision of other normal pregnancy, antepartum Patient is doing well without complaints  2. Choroid plexus cyst of fetus affecting care of mother, antepartum, single or unspecified fetus Normal NIPS Resolved on 10/08/16 ultrasound. No further follow up required  Preterm labor symptoms and general obstetric precautions including but not limited to vaginal bleeding, contractions, leaking of fluid and fetal  movement were reviewed in detail with the patient. Please refer to After Visit Summary for other counseling recommendations.  No Follow-up on file.   Catalina AntiguaPeggy Khrystal Jeanmarie, MD

## 2016-11-15 ENCOUNTER — Ambulatory Visit (INDEPENDENT_AMBULATORY_CARE_PROVIDER_SITE_OTHER): Payer: Medicaid Other | Admitting: Certified Nurse Midwife

## 2016-11-15 DIAGNOSIS — Z3480 Encounter for supervision of other normal pregnancy, unspecified trimester: Secondary | ICD-10-CM

## 2016-11-15 DIAGNOSIS — Z3483 Encounter for supervision of other normal pregnancy, third trimester: Secondary | ICD-10-CM

## 2016-11-15 NOTE — Patient Instructions (Addendum)
Contraception Choices Birth control (contraception) is the use of any methods or devices to stop pregnancy from happening. Below are some methods to help avoid pregnancy. Hormonal birth control  A small tube put under the skin of the upper arm (implant). The tube can stay in place for 3 years. The implant must be taken out after 3 years.  Shots given every 3 months.  Pills taken every day.  Patches that are changed once a week.  A ring put into the vagina (vaginal ring). The ring is left in place for 3 weeks and removed for 1 week. Then, a new ring is put in the vagina.  Emergency birth control pills taken after unprotected sex (intercourse). Barrier birth control  A thin covering worn on the penis (female condom) during sex.  A soft, loose covering put into the vagina (female condom) before sex.  A rubber bowl that sits over the cervix (diaphragm). The bowl must be made for you. The bowl is put into the vagina before sex. The bowl is left in place for 6 to 8 hours after sex.  A small, soft cup that fits over the cervix (cervical cap). The cup must be made for you. The cup can be left in place for 48 hours after sex.  A sponge that is put into the vagina before sex.  A chemical that kills or stops sperm from getting into the cervix and uterus (spermicide). The chemical may be a cream, jelly, foam, or pill. Intrauterine (IUD) birth control  IUD birth control is a small, T-shaped piece of plastic. The plastic is put inside the uterus. There are 2 types of IUD:  Copper IUD. The IUD is covered in copper wire. The copper makes a fluid that kills sperm. It can stay in place for 10 years.  Hormone IUD. The hormone stops pregnancy from happening. It can stay in place for 5 years. Permanent methods  When the woman has her fallopian tubes sealed, tied, or blocked during surgery. This stops the egg from traveling to the uterus.  The doctor places a small coil or insert into each fallopian  tube. This causes scar tissue to form and blocks the fallopian tubes.  When the female has the tubes that carry sperm tied off (vasectomy). Natural family planning birth control  Natural family planning means not having sex or using barrier birth control on the days the woman could become pregnant.  Use a calendar to keep track of the length of each period and know the days she can get pregnant.  Avoid sex during ovulation.  Use a thermometer to measure body temperature. Also watch for symptoms of ovulation.  Time sex to be after the woman has ovulated. Use condoms to help protect yourself against sexually transmitted infections (STIs). Do this no matter what type of birth control you use. Talk to your doctor about which type of birth control is best for you. This information is not intended to replace advice given to you by your health care provider. Make sure you discuss any questions you have with your health care provider. Document Released: 07/18/2009 Document Revised: 02/26/2016 Document Reviewed: 04/11/2013 Elsevier Interactive Patient Education  2017 [employer].  

## 2016-11-15 NOTE — Progress Notes (Signed)
   PRENATAL VISIT NOTE  Subjective:  Michele Everett is a 36 y.o. G3P2002 at 8260w1d being seen today for ongoing prenatal care.  She is currently monitored for the following issues for this low-risk pregnancy and has Prenatal care; Supervision of other normal pregnancy, antepartum; and Choroid plexus cyst of fetus affecting care of mother, antepartum on her problem list.  Patient reports no complaints.  Contractions: Not present. Vag. Bleeding: None.  Movement: Present. Denies leaking of fluid.   The following portions of the patient's history were reviewed and updated as appropriate: allergies, current medications, past family history, past medical history, past social history, past surgical history and problem list. Problem list updated.  Objective:   Vitals:   11/15/16 0907  BP: 108/69  Pulse: 98  Weight: 155 lb (70.3 kg)    Fetal Status: Fetal Heart Rate (bpm): 134 Fundal Height: 33 cm Movement: Present     General:  Alert, oriented and cooperative. Patient is in no acute distress.  Skin: Skin is warm and dry. No rash noted.   Cardiovascular: Normal heart rate noted  Respiratory: Normal respiratory effort, no problems with respiration noted  Abdomen: Soft, gravid, appropriate for gestational age. Pain/Pressure: Present     Pelvic:  Cervical exam deferred        Extremities: Normal range of motion.     Mental Status: Normal mood and affect. Normal behavior. Normal judgment and thought content.   Assessment and Plan:  Pregnancy: G3P2002 at 3460w1d  1. Supervision of other normal pregnancy, antepartum     Doing well  Preterm labor symptoms and general obstetric precautions including but not limited to vaginal bleeding, contractions, leaking of fluid and fetal movement were reviewed in detail with the patient. Please refer to After Visit Summary for other counseling recommendations.  Return in about 1 week (around 11/22/2016) for ROB, GBS.   Roe Coombsachelle A [place], CNM

## 2016-11-23 ENCOUNTER — Ambulatory Visit (INDEPENDENT_AMBULATORY_CARE_PROVIDER_SITE_OTHER): Payer: Medicaid Other | Admitting: Certified Nurse Midwife

## 2016-11-23 ENCOUNTER — Other Ambulatory Visit (HOSPITAL_COMMUNITY)
Admission: RE | Admit: 2016-11-23 | Discharge: 2016-11-23 | Disposition: A | Discharge status: Home / Self Care | Payer: Medicaid Other | Source: Ambulatory Visit | Attending: Certified Nurse Midwife | Admitting: Certified Nurse Midwife

## 2016-11-23 VITALS — BP 111/65 | HR 95 | Wt 156.4 lb

## 2016-11-23 DIAGNOSIS — Z3483 Encounter for supervision of other normal pregnancy, third trimester: Secondary | ICD-10-CM | POA: Diagnosis present

## 2016-11-23 DIAGNOSIS — Z3A35 35 weeks gestation of pregnancy: Secondary | ICD-10-CM | POA: Diagnosis not present

## 2016-11-23 DIAGNOSIS — Z3480 Encounter for supervision of other normal pregnancy, unspecified trimester: Secondary | ICD-10-CM

## 2016-11-23 LAB — OB RESULTS CONSOLE GBS: GBS: POSITIVE

## 2016-11-23 NOTE — Progress Notes (Signed)
   PRENATAL VISIT NOTE  Subjective:  Michele Everett is a 36 y.o. G3P2002 at 6333w2d being seen today for ongoing prenatal care.  She is currently monitored for the following issues for this low-risk pregnancy and has Prenatal care; Supervision of other normal pregnancy, antepartum; and Choroid plexus cyst of fetus affecting care of mother, antepartum on her problem list.  Patient reports no complaints.  Contractions: Not present. Vag. Bleeding: None.  Movement: Present. Denies leaking of fluid.   The following portions of the patient's history were reviewed and updated as appropriate: allergies, current medications, past family history, past medical history, past social history, past surgical history and problem list. Problem list updated.  Objective:   Vitals:   11/23/16 0922  BP: 111/65  Pulse: 95  Weight: 156 lb 6.4 oz (70.9 kg)    Fetal Status: Fetal Heart Rate (bpm): 130 Fundal Height: 35 cm Movement: Present  Presentation: Vertex  General:  Alert, oriented and cooperative. Patient is in no acute distress.  Skin: Skin is warm and dry. No rash noted.   Cardiovascular: Normal heart rate noted  Respiratory: Normal respiratory effort, no problems with respiration noted  Abdomen: Soft, gravid, appropriate for gestational age. Pain/Pressure: Absent     Pelvic:  Cervical exam performed Dilation: 1 Effacement (%): 0 Station: Ballotable  Extremities: Normal range of motion.  Edema: None  Mental Status: Normal mood and affect. Normal behavior. Normal judgment and thought content.   Assessment and Plan:  Pregnancy: G3P2002 at 6133w2d  1. Supervision of other normal pregnancy, antepartum     Doing well - Culture, beta strep (group b only) - Cervicovaginal ancillary only  Preterm labor symptoms and general obstetric precautions including but not limited to vaginal bleeding, contractions, leaking of fluid and fetal movement were reviewed in detail with the patient. Please refer to After  Visit Summary for other counseling recommendations.  Return in about 1 week (around 11/30/2016) for ROB.   Roe Coombsachelle A Eular Panek, CNM

## 2016-11-24 LAB — CERVICOVAGINAL ANCILLARY ONLY
Bacterial vaginitis: NEGATIVE
CANDIDA VAGINITIS: POSITIVE — AB
CHLAMYDIA, DNA PROBE: NEGATIVE
Neisseria Gonorrhea: NEGATIVE
Trichomonas: NEGATIVE

## 2016-11-26 ENCOUNTER — Other Ambulatory Visit: Payer: Self-pay | Admitting: Certified Nurse Midwife

## 2016-11-26 DIAGNOSIS — B951 Streptococcus, group B, as the cause of diseases classified elsewhere: Secondary | ICD-10-CM | POA: Insufficient documentation

## 2016-11-26 DIAGNOSIS — B3731 Acute candidiasis of vulva and vagina: Secondary | ICD-10-CM

## 2016-11-26 DIAGNOSIS — B373 Candidiasis of vulva and vagina: Secondary | ICD-10-CM

## 2016-11-26 LAB — CULTURE, BETA STREP (GROUP B ONLY): Strep Gp B Culture: POSITIVE — AB

## 2016-11-26 MED ORDER — FLUCONAZOLE 150 MG PO TABS: 150.0000 mg | ORAL_TABLET | Freq: Once | ORAL | 0 refills | Status: AC

## 2016-12-01 ENCOUNTER — Ambulatory Visit (INDEPENDENT_AMBULATORY_CARE_PROVIDER_SITE_OTHER): Payer: Medicaid Other | Admitting: Certified Nurse Midwife

## 2016-12-01 ENCOUNTER — Encounter: Payer: Self-pay | Admitting: Certified Nurse Midwife

## 2016-12-01 VITALS — BP 105/69 | HR 98 | Wt 156.0 lb

## 2016-12-01 DIAGNOSIS — Z3483 Encounter for supervision of other normal pregnancy, third trimester: Secondary | ICD-10-CM

## 2016-12-01 DIAGNOSIS — B951 Streptococcus, group B, as the cause of diseases classified elsewhere: Secondary | ICD-10-CM

## 2016-12-01 DIAGNOSIS — Z3480 Encounter for supervision of other normal pregnancy, unspecified trimester: Secondary | ICD-10-CM

## 2016-12-01 NOTE — Progress Notes (Signed)
   PRENATAL VISIT NOTE  Subjective:  Michele Everett is a 36 y.o. G3P2002 at 5471w3d being seen today for ongoing prenatal care.  She is currently monitored for the following issues for this low-risk pregnancy and has Prenatal care; Supervision of other normal pregnancy, antepartum; Choroid plexus cyst of fetus affecting care of mother, antepartum; and Positive GBS test on her problem list.  Patient reports no complaints.  Contractions: Not present. Vag. Bleeding: None.  Movement: Present. Denies leaking of fluid.   The following portions of the patient's history were reviewed and updated as appropriate: allergies, current medications, past family history, past medical history, past social history, past surgical history and problem list. Problem list updated.  Objective:   Vitals:   12/01/16 0853  BP: 105/69  Pulse: 98  Weight: 156 lb (70.8 kg)    Fetal Status: Fetal Heart Rate (bpm): 147 Fundal Height: 36 cm Movement: Present  Presentation: Vertex  General:  Alert, oriented and cooperative. Patient is in no acute distress.  Skin: Skin is warm and dry. No rash noted.   Cardiovascular: Normal heart rate noted  Respiratory: Normal respiratory effort, no problems with respiration noted  Abdomen: Soft, gravid, appropriate for gestational age. Pain/Pressure: Present     Pelvic:  Cervical exam deferred        Extremities: Normal range of motion.     Mental Status: Normal mood and affect. Normal behavior. Normal judgment and thought content.   Assessment and Plan:  Pregnancy: G3P2002 at 4571w3d  1. Supervision of other normal pregnancy, antepartum      Doing well.   2. Positive GBS test     PCN for delivery/labor  Preterm labor symptoms and general obstetric precautions including but not limited to vaginal bleeding, contractions, leaking of fluid and fetal movement were reviewed in detail with the patient. Please refer to After Visit Summary for other counseling recommendations.    Return in about 1 week (around 12/08/2016) for ROB.   Roe Coombsachelle A [place], CNM

## 2016-12-08 ENCOUNTER — Encounter: Payer: Self-pay | Admitting: Certified Nurse Midwife

## 2016-12-08 ENCOUNTER — Ambulatory Visit (INDEPENDENT_AMBULATORY_CARE_PROVIDER_SITE_OTHER): Payer: Medicaid Other | Admitting: Certified Nurse Midwife

## 2016-12-08 VITALS — BP 109/75 | HR 97 | Wt 160.0 lb

## 2016-12-08 DIAGNOSIS — B951 Streptococcus, group B, as the cause of diseases classified elsewhere: Secondary | ICD-10-CM

## 2016-12-08 DIAGNOSIS — Z3483 Encounter for supervision of other normal pregnancy, third trimester: Secondary | ICD-10-CM

## 2016-12-08 DIAGNOSIS — Z3480 Encounter for supervision of other normal pregnancy, unspecified trimester: Secondary | ICD-10-CM

## 2016-12-08 NOTE — Progress Notes (Signed)
Patient reports she is doing well- she wants to know when she is going to deliver.

## 2016-12-08 NOTE — Progress Notes (Signed)
   PRENATAL VISIT NOTE  Subjective:  Michele Everett is a 36 y.o. G3P2002 at 8107w3d being seen today for ongoing prenatal care.  She is currently monitored for the following issues for this low-risk pregnancy and has Prenatal care; Supervision of other normal pregnancy, antepartum; Choroid plexus cyst of fetus affecting care of mother, antepartum; and Positive GBS test on her problem list.  Patient reports no complaints.  Contractions: Not present. Vag. Bleeding: None.  Movement: Present. Denies leaking of fluid.   The following portions of the patient's history were reviewed and updated as appropriate: allergies, current medications, past family history, past medical history, past social history, past surgical history and problem list. Problem list updated.  Objective:   Vitals:   12/08/16 1606  BP: 109/75  Pulse: 97  Weight: 160 lb (72.6 kg)    Fetal Status: Fetal Heart Rate (bpm): 145 Fundal Height: 37 cm Movement: Present     General:  Alert, oriented and cooperative. Patient is in no acute distress.  Skin: Skin is warm and dry. No rash noted.   Cardiovascular: Normal heart rate noted  Respiratory: Normal respiratory effort, no problems with respiration noted  Abdomen: Soft, gravid, appropriate for gestational age. Pain/Pressure: Present     Pelvic:  Cervical exam deferred        Extremities: Normal range of motion.  Edema: None  Mental Status: Normal mood and affect. Normal behavior. Normal judgment and thought content.   Assessment and Plan:  Pregnancy: G3P2002 at 96107w3d  1. Supervision of other normal pregnancy, antepartum      Doing well.   2. Positive GBS test      PCN for delivery  Term labor symptoms and general obstetric precautions including but not limited to vaginal bleeding, contractions, leaking of fluid and fetal movement were reviewed in detail with the patient. Please refer to After Visit Summary for other counseling recommendations.  Return in about 1 week  (around 12/15/2016) for ROB.   Roe Coombsachelle A Marvens Hollars, CNM

## 2016-12-13 ENCOUNTER — Encounter: Payer: Self-pay | Admitting: Certified Nurse Midwife

## 2016-12-16 ENCOUNTER — Inpatient Hospital Stay (HOSPITAL_COMMUNITY): Payer: Medicaid Other

## 2016-12-16 ENCOUNTER — Ambulatory Visit (INDEPENDENT_AMBULATORY_CARE_PROVIDER_SITE_OTHER): Payer: Medicaid Other | Admitting: Certified Nurse Midwife

## 2016-12-16 ENCOUNTER — Encounter (HOSPITAL_COMMUNITY): Payer: Self-pay | Admitting: *Deleted

## 2016-12-16 ENCOUNTER — Inpatient Hospital Stay (EMERGENCY_DEPARTMENT_HOSPITAL)
Admission: AD | Admit: 2016-12-16 | Discharge: 2016-12-16 | Disposition: A | Discharge status: Home / Self Care | Payer: Medicaid Other | Source: Ambulatory Visit | Attending: Obstetrics and Gynecology | Admitting: Obstetrics and Gynecology

## 2016-12-16 ENCOUNTER — Encounter: Payer: Self-pay | Admitting: Certified Nurse Midwife

## 2016-12-16 VITALS — BP 131/72 | HR 97 | Wt 160.0 lb

## 2016-12-16 DIAGNOSIS — O288 Other abnormal findings on antenatal screening of mother: Secondary | ICD-10-CM | POA: Diagnosis not present

## 2016-12-16 DIAGNOSIS — O321XX0 Maternal care for breech presentation, not applicable or unspecified: Secondary | ICD-10-CM | POA: Diagnosis present

## 2016-12-16 DIAGNOSIS — Z3A38 38 weeks gestation of pregnancy: Secondary | ICD-10-CM | POA: Diagnosis not present

## 2016-12-16 DIAGNOSIS — Z3480 Encounter for supervision of other normal pregnancy, unspecified trimester: Secondary | ICD-10-CM

## 2016-12-16 DIAGNOSIS — Z3483 Encounter for supervision of other normal pregnancy, third trimester: Secondary | ICD-10-CM | POA: Diagnosis not present

## 2016-12-16 DIAGNOSIS — B951 Streptococcus, group B, as the cause of diseases classified elsewhere: Secondary | ICD-10-CM

## 2016-12-16 NOTE — Discharge Instructions (Signed)
Fetal Movement Counts Patient Name: ________________________________________________ Patient Due Date: ____________________ What is a fetal movement count? A fetal movement count is the number of times that you feel your baby move during a certain amount of time. This may also be called a fetal kick count. A fetal movement count is recommended for every pregnant woman. You may be asked to start counting fetal movements as early as week 28 of your pregnancy. Pay attention to when your baby is most active. You may notice your baby's sleep and wake cycles. You may also notice things that make your baby move more. You should do a fetal movement count:  When your baby is normally most active.  At the same time each day. A good time to count movements is while you are resting, after having something to eat and drink. How do I count fetal movements? 1. Find a quiet, comfortable area. Sit, or lie down on your side. 2. Write down the date, the start time and stop time, and the number of movements that you felt between those two times. Take this information with you to your health care visits. 3. For 2 hours, count kicks, flutters, swishes, rolls, and jabs. You should feel at least 10 movements during 2 hours. 4. You may stop counting after you have felt 10 movements. 5. If you do not feel 10 movements in 2 hours, have something to eat and drink. Then, keep resting and counting for 1 hour. If you feel at least 4 movements during that hour, you may stop counting. Contact a health care provider if:  You feel fewer than 4 movements in 2 hours.  Your baby is not moving like he or she usually does. Date: ____________ Start time: ____________ Stop time: ____________ Movements: ____________ Date: ____________ Start time: ____________ Stop time: ____________ Movements: ____________ Date: ____________ Start time: ____________ Stop time: ____________ Movements: ____________ Date: ____________ Start time:  ____________ Stop time: ____________ Movements: ____________ Date: ____________ Start time: ____________ Stop time: ____________ Movements: ____________ Date: ____________ Start time: ____________ Stop time: ____________ Movements: ____________ Date: ____________ Start time: ____________ Stop time: ____________ Movements: ____________ Date: ____________ Start time: ____________ Stop time: ____________ Movements: ____________ Date: ____________ Start time: ____________ Stop time: ____________ Movements: ____________ This information is not intended to replace advice given to you by your health care provider. Make sure you discuss any questions you have with your health care provider. Document Released: 10/20/2006 Document Revised: 05/19/2016 Document Reviewed: 10/30/2015 Elsevier Interactive Patient Education  2017 [employer]. Breech Birth What is a breech birth? A breech birth is when a baby is born with the buttocks or the feet first. Most babies are in a head down (vertex) position when they are born. There are three types of breech babies:  When the babys buttocks are showing first in the birth canal (vagina) with the legs straight up and the feet at the babys head (frank breech).  When the babys buttocks shows first with the legs bent at the knees and the feet down near the buttocks (complete breech).  When one or both of the babys feet are down below the buttocks (footling breech). What are the risks of a breech birth? Having a breech birth increases the risk to your baby. A breech birth may cause the following:  Umbilical cord prolapse. This is when the umbilical cord is in front of the baby before or during labor. This can cause the cord to become pinched or compressed. This can reduce the flow of  blood and oxygen to the baby.  The baby getting stuck in the birth canal, which can cause injury or, rarely, death.  Injury to the nerves in the shoulder, arm, and hand (brachial  plexus injury) when delivered.  Your baby being born too early (prematurely).  An increased need for a cesarean delivery. What increases the risk of having a breech baby? It is not known what causes your baby to be breech. However, risk factors that may increase your chances of having a breech baby include the following:  The mother having had several babies already.  The mother having twins or more.  The mother having a baby with certain congenital disabilities.  The mother going into labor early.  The mother having problems with her uterus, such as a tumor.  The mother having placenta problems (placenta previa) or too much or not enough fluid surrounding the baby (amniotic fluid). How do I know if my baby is breech? There are no symptoms for you to know that your baby is breech. When you are close to your due date, your health care provider can tell if your baby is breech by:  An abdominal or vaginal (pelvic) exam.  An ultrasound. Your health care provider may also be able to tell that your baby is breech if your babys heartbeat is heard above your belly button. What can be done if my baby is breech?   Your health care provider may try to turn the baby in your uterus. This is a procedure called external cephalic version (ECV). This is done by your health care provider. He or she will place both hands on your abdomen and gently and slowly turn the baby around. It is important to know that ECV can increase your chances of suddenly going into labor. If an ECV is done, it is done toward the end of a healthy pregnancy. The baby may remain in this position or he or she may turn back to the breech position. You and your health care provider will discuss if an ECV is recommended for you and your baby. How will I delivery my baby if my baby is breech? You and your health care provider will discuss the best way to deliver your baby. If your baby is breech, it is less likely that a vaginal  delivery will be recommended due to the risks. Some breech babies may be delivered safely without a cesarean, while in other cases health care providers will recommend a cesarean delivery. This information is not intended to replace advice given to you by your health care provider. Make sure you discuss any questions you have with your health care provider. Document Released: 11/11/2006 Document Revised: 09/06/2016 Document Reviewed: 07/25/2014 Elsevier Interactive Patient Education  2017 [employer]

## 2016-12-16 NOTE — MAU Note (Signed)
Pt sent for nonreactive NST. Pt is breech/transverse . Was 1cm dilated in office today. Pt also stated she is feeling wet.

## 2016-12-16 NOTE — Progress Notes (Signed)
   PRENATAL VISIT NOTE  Subjective:  Michele Everett is a 36 y.o. G3P2002 at 728w4d being seen today for ongoing prenatal care.  She is currently monitored for the following issues for this low-risk pregnancy and has Prenatal care; Supervision of other normal pregnancy, antepartum; Choroid plexus cyst of fetus affecting care of mother, antepartum; and Positive GBS test on her problem list.  Patient reports no complaints.  Contractions: Not present. Vag. Bleeding: None.  Movement: Present, decreased. Denies leaking of fluid.   The following portions of the patient's history were reviewed and updated as appropriate: allergies, current medications, past family history, past medical history, past social history, past surgical history and problem list. Problem list updated.  Objective:   Vitals:   12/16/16 1421  BP: 131/72  Pulse: 97  Weight: 160 lb (72.6 kg)    Fetal Status: Fetal Heart Rate (bpm): NST; non reactive Fundal Height: 39 cm Movement: Present  Presentation: Transverse  General:  Alert, oriented and cooperative. Patient is in no acute distress.  Skin: Skin is warm and dry. No rash noted.   Cardiovascular: Normal heart rate noted  Respiratory: Normal respiratory effort, no problems with respiration noted  Abdomen: Soft, gravid, appropriate for gestational age. Pain/Pressure: Present     Pelvic:  Cervical exam performed Dilation: 1 Effacement (%): 0 Station: Ballotable  Extremities: Normal range of motion.  Edema: None  Mental Status: Normal mood and affect. Normal behavior. Normal judgment and thought content.   Assessment and Plan:  Pregnancy: G3P2002 at 128w4d  1. Supervision of other normal pregnancy, antepartum      Non-reactive NST: no accels, occasional variable down to 120 with quick return to baseline, contractions noted every 5-7 minutes.  Non vertex position noted on cervical exam.  Dr.Constance notified of non-reactive NST, contractions and transverse fetal lie.   Sent to MAU.   - US MFM OB LIMITED; Future  2. Positive GBS test     PCN for delivery/labor.   Term labor symptoms and general obstetric precautions including but not limited to vaginal bleeding, contractions, leaking of fluid and fetal movement were reviewed in detail with the patient. Please refer to After Visit Summary for other counseling recommendations.  Return in about 1 week (around 12/23/2016) for ROB.if not delivered.    Roe Coombsachelle A Keydi Giel, CNM

## 2016-12-16 NOTE — MAU Provider Note (Signed)
Chief Complaint:  Non-stress Test   First Provider Initiated Contact with Patient 12/16/16 1855      HPI: Michele Everett is a 36 y.o. G3P2002 at [redacted]w[redacted]d who presents to maternity admissions reporting sent in from office due to non-reactive NST.  Patient was in office today, and received an NST (unclear for indication, maybe decreased fetal movement?). Had a variable decel and no accels, was sent in to MAU. Patient also states she had felt baby move today, especially when arrived in MAU, but it was decreased earlier today.   Occasional contractions, denies leakage of fluid or vaginal bleeding.  Pregnancy Course:   Past Medical History: Past Medical History:  Diagnosis Date  . Medical history non-contributory     Past obstetric history: OB History  Gravida Para Term Preterm AB Living  3 2 2     2   SAB TAB Ectopic Multiple Live Births          2    # Outcome Date GA Lbr Len/2nd Weight Sex Delivery Anes PTL Lv  3 Current           2 Term      Vag-Spont     1 Term      Vag-Spont         Past Surgical History: Past Surgical History:  Procedure Laterality Date  . NO PAST SURGERIES       Family History: Family History  Problem Relation Age of Onset  . Cancer Paternal Grandfather   . Hypertension Paternal Grandfather   . Asthma Paternal Grandfather   . Diabetes Paternal Grandfather   . Heart disease Paternal Grandfather   . Hearing loss Paternal Grandfather   . Stroke Paternal Grandfather   . Alcohol abuse Neg Hx   . Arthritis Neg Hx   . Birth defects Neg Hx   . COPD Neg Hx   . Depression Neg Hx   . Drug abuse Neg Hx   . Early death Neg Hx   . Hyperlipidemia Neg Hx   . Kidney disease Neg Hx   . Learning disabilities Neg Hx   . Mental illness Neg Hx   . Mental retardation Neg Hx   . Miscarriages / Stillbirths Neg Hx   . Vision loss Neg Hx   . Varicose Veins Neg Hx     Social History: Social History  Substance Use Topics  . Smoking status: Never Smoker  .  Smokeless tobacco: Never Used  . Alcohol use No    Allergies: No Known Allergies  Meds:  No prescriptions prior to admission.    I have reviewed patient's Past Medical Hx, Surgical Hx, Family Hx, Social Hx, medications and allergies.   ROS:  A comprehensive ROS was negative except per HPI.    Physical Exam   Patient Vitals for the past 24 hrs:  BP Temp Temp src Pulse Resp Weight  12/16/16 1852 112/64 - - 96 16 -  12/16/16 1757 (!) 97/49 - - 90 - -  12/16/16 1613 125/69 98.8 F (37.1 C) Oral 91 18 159 lb 1.3 oz (72.2 kg)   Constitutional: Well-developed, well-nourished female in no acute distress.  Cardiovascular: normal rate Respiratory: normal effort GI: Abd soft, non-tender, gravid appropriate for gestational age. Pos BS x 4 MS: Extremities nontender, no edema, normal ROM Neurologic: Alert and oriented x 4.  GU: Neg CVAT.    Labs: No results found for this or any previous visit (from the past 24 hour(s)).  Imaging:  No  results found.  MAU Course: NST - reactive with a variable decel when laying on left side BPP 8/8 - breech Set up for ECVersion tomorrow at 9AM  MDM: Plan of care reviewed with patient, including labs and tests ordered and medical treatment. Patient was found to have a BPP of 8/8, only had a variable when moved to left side, but otherwise was reactive entire visit in MAU. Patient found to still be in breech position, set up to return tomorrow for external cephalic version, due to patient desires to have a vaginal birth. Reviewed risks associated with ECV, patient would still like to have done. Patient feeling baby move by end of visit.  I personally reviewed the patient's NST today, found to be REACTIVE. 130 bpm, mod var, +accels, a single variable decel, resolved to baseline, rest of FHT was REACTIVE with accels. CTX: Irregular, not feeling strong. BPP 8/8.   Assessment: 1. Non-reactive NST (non-stress test)     Plan: Discharge home in stable  condition.  Labor precautions and fetal kick counts  Follow-up Information    [facility]. Go in 1 week(s).   Specialty:  Obstetrics and Gynecology Why:  OB apointment Contact information: [address], [address] [place] [id number] [phone]       [facility]).   Why:  [facility]15[facility] (to turn baby). Contact information: [address] [place] [phone]          Allergies as of 12/16/2016   No Known Allergies     Medication List    TAKE these medications   acetaminophen 500 MG tablet Commonly known as:  TYLENOL Take 500 mg by mouth every 6 (six) hours as needed for headache.   prenatal vitamin w/FE, FA 29-1 MG Chew chewable tablet Chew 1 tablet by mouth daily at 12 noon.       Jen MowElizabeth Psalm Arman, DO OB Fellow [facility] 12/16/2016 6:59 PM

## 2016-12-16 NOTE — MAU Note (Signed)
Urine in lab 

## 2016-12-16 NOTE — Progress Notes (Signed)
Pt presents for ROB requests cx check today.  

## 2016-12-16 NOTE — MAU Note (Signed)
Sent from the office, ? Non-reactive.   Possible version. Reports some wetness after exam, none since

## 2016-12-17 ENCOUNTER — Encounter (HOSPITAL_COMMUNITY): Payer: Self-pay

## 2016-12-17 ENCOUNTER — Observation Stay (HOSPITAL_COMMUNITY)
Admission: AD | Admit: 2016-12-17 | Discharge: 2016-12-17 | Disposition: A | Discharge status: Home / Self Care | Payer: Medicaid Other | Source: Ambulatory Visit | Attending: Family Medicine | Admitting: Family Medicine

## 2016-12-17 DIAGNOSIS — Z3A38 38 weeks gestation of pregnancy: Secondary | ICD-10-CM | POA: Insufficient documentation

## 2016-12-17 DIAGNOSIS — O350XX0 Maternal care for (suspected) central nervous system malformation in fetus, not applicable or unspecified: Secondary | ICD-10-CM

## 2016-12-17 DIAGNOSIS — O328XX0 Maternal care for other malpresentation of fetus, not applicable or unspecified: Secondary | ICD-10-CM

## 2016-12-17 DIAGNOSIS — O321XX0 Maternal care for breech presentation, not applicable or unspecified: Principal | ICD-10-CM | POA: Insufficient documentation

## 2016-12-17 DIAGNOSIS — O3503X0 Maternal care for (suspected) central nervous system malformation or damage in fetus, choroid plexus cysts, not applicable or unspecified: Secondary | ICD-10-CM

## 2016-12-17 MED ORDER — TERBUTALINE SULFATE 1 MG/ML IJ SOLN
0.2500 mg | Freq: Once | INTRAMUSCULAR | Status: AC
  Administered 2016-12-17: 0.25 mg via SUBCUTANEOUS
  Filled 2016-12-17: qty 1

## 2016-12-17 MED ORDER — LACTATED RINGERS IV SOLN
INTRAVENOUS | Status: DC
  Administered 2016-12-17: 09:00:00 via INTRAVENOUS

## 2016-12-17 NOTE — Discharge Instructions (Signed)
Breech Birth What is a breech birth? A breech birth is when a baby is born with the buttocks or the feet first. Most babies are in a head down (vertex) position when they are born. There are three types of breech babies:  When the baby's buttocks are showing first in the birth canal (vagina) with the legs straight up and the feet at the baby's head (frank breech).  When the baby's buttocks shows first with the legs bent at the knees and the feet down near the buttocks (complete breech).  When one or both of the baby's feet are down below the buttocks (footling breech).  What are the risks of a breech birth? Having a breech birth increases the risk to your baby. A breech birth may cause the following:  Umbilical cord prolapse. This is when the umbilical cord is in front of the baby before or during labor. This can cause the cord to become pinched or compressed. This can reduce the flow of blood and oxygen to the baby.  The baby getting stuck in the birth canal, which can cause injury or, rarely, death.  Injury to the nerves in the shoulder, arm, and hand (brachial plexus injury) when delivered.  Your baby being born too early (prematurely).  An increased need for a cesarean delivery.  What increases the risk of having a breech baby? It is not known what causes your baby to be breech. However, risk factors that may increase your chances of having a breech baby include the following:  The mother having had several babies already.  The mother having twins or more.  The mother having a baby with certain congenital disabilities.  The mother going into labor early.  The mother having problems with her uterus, such as a tumor.  The mother having placenta problems (placenta previa) or too much or not enough fluid surrounding the baby (amniotic fluid).  How do I know if my baby is breech? There are no symptoms for you to know that your baby is breech. When you are close to your due date,  your health care provider can tell if your baby is breech by:  An abdominal or vaginal (pelvic) exam.  An ultrasound.  Your health care provider may also be able to tell that your baby is breech if your baby's heartbeat is heard above your belly button. What can be done if my baby is breech?  Your health care provider may try to turn the baby in your uterus. This is a procedure called external cephalic version (ECV). This is done by your health care provider. He or she will place both hands on your abdomen and gently and slowly turn the baby around. It is important to know that ECV can increase your chances of suddenly going into labor. If an ECV is done, it is done toward the end of a healthy pregnancy. The baby may remain in this position or he or she may turn back to the breech position. You and your health care provider will discuss if an ECV is recommended for you and your baby. How will I delivery my baby if my baby is breech? You and your health care provider will discuss the best way to deliver your baby. If your baby is breech, it is less likely that a vaginal delivery will be recommended due to the risks. Some breech babies may be delivered safely without a cesarean, while in other cases health care providers will recommend a cesarean delivery. This   information is not intended to replace advice given to you by your health care provider. Make sure you discuss any questions you have with your health care provider. Document Released: 11/11/2006 Document Revised: 09/06/2016 Document Reviewed: 07/25/2014 Elsevier Interactive Patient Education  2017 [employer] Inc.  

## 2016-12-17 NOTE — Progress Notes (Signed)
Patient ID: Michele Everett, female   DOB: 1981/05/23, 36 y.o.   MRN: [id number] After informed verbal consent, Terbutaline 0.25 mg SQ given, ECV was attempted under Ultrasound guidance.  Forward roll completed after intial turn to transverse, then to vertex.   FHR was reactive before and after the procedure.   Pt. Tolerated the procedure well.

## 2016-12-17 NOTE — H&P (Signed)
Michele Everett is an 36 y.o. [id number] 6753w5d female.   Chief Complaint: breech presentation HPI: here for ECV after finding of breech yesterday.  Past Medical History:  Diagnosis Date  . Medical history non-contributory     Past Surgical History:  Procedure Laterality Date  . NO PAST SURGERIES      Family History  Problem Relation Age of Onset  . Cancer Paternal Grandfather   . Hypertension Paternal Grandfather   . Asthma Paternal Grandfather   . Diabetes Paternal Grandfather   . Heart disease Paternal Grandfather   . Hearing loss Paternal Grandfather   . Stroke Paternal Grandfather   . Alcohol abuse Neg Hx   . Arthritis Neg Hx   . Birth defects Neg Hx   . COPD Neg Hx   . Depression Neg Hx   . Drug abuse Neg Hx   . Early death Neg Hx   . Hyperlipidemia Neg Hx   . Kidney disease Neg Hx   . Learning disabilities Neg Hx   . Mental illness Neg Hx   . Mental retardation Neg Hx   . Miscarriages / Stillbirths Neg Hx   . Vision loss Neg Hx   . Varicose Veins Neg Hx    Social History:  reports that she has never smoked. She has never used smokeless tobacco. She reports that she does not drink alcohol or use drugs.   No Known Allergies  No current facility-administered medications on file prior to encounter.    Current Outpatient Prescriptions on File Prior to Encounter  Medication Sig Dispense Refill  . acetaminophen (TYLENOL) 500 MG tablet Take 500 mg by mouth every 6 (six) hours as needed for headache.    . prenatal vitamin w/FE, FA (NATACHEW) 29-1 MG CHEW chewable tablet Chew 1 tablet by mouth daily at 12 noon. 30 tablet 10    A comprehensive review of systems was negative.  Blood pressure 120/68, pulse 88, temperature 97.9 F (36.6 C), temperature source Oral, resp. rate 20, height 5\' 3"  (1.6 m), weight 159 lb (72.1 kg), last menstrual period 03/29/2016. General appearance: alert, cooperative and appears stated age Head: Normocephalic, without obvious abnormality,  atraumatic Neck: supple, symmetrical, trachea midline Lungs: normal effort Heart: regular rate and rhythm Abdomen: gravid, NT Extremities: Homans sign is negative, no sign of DVT Skin: Skin color, texture, turgor normal. No rashes or lesions Neurologic: Grossly normal   Lab Results  Component Value Date   WBC 7.9 10/07/2016   HCT 35.6 10/07/2016   MCV 91 10/07/2016   PLT 304 10/07/2016         ABO, Rh: B/Positive/-- (09/05 1641)  Antibody: Negative (09/05 1641)  Rubella: !Error!  RPR: Non Reactive (01/04 1110)  HBsAg: Negative (09/05 1641)  HIV: Non Reactive (01/04 1110)  GBS: Positive (02/20 0000)     Assessment/Plan Active Problems:   Breech presentation on examination  For ECV.  Reva Boresanya S Ross Bender 12/17/2016, 9:22 AM

## 2016-12-22 ENCOUNTER — Inpatient Hospital Stay (HOSPITAL_COMMUNITY)
Admission: AD | Admit: 2016-12-22 | Discharge: 2016-12-22 | Disposition: A | Discharge status: Home / Self Care | Payer: Medicaid Other | Source: Ambulatory Visit | Attending: Obstetrics & Gynecology | Admitting: Obstetrics & Gynecology

## 2016-12-22 ENCOUNTER — Ambulatory Visit (HOSPITAL_COMMUNITY)
Admission: RE | Admit: 2016-12-22 | Discharge: 2016-12-22 | Disposition: A | Discharge status: Home / Self Care | Payer: Medicaid Other | Source: Ambulatory Visit | Attending: Certified Nurse Midwife | Admitting: Certified Nurse Midwife

## 2016-12-22 DIAGNOSIS — Z3A39 39 weeks gestation of pregnancy: Secondary | ICD-10-CM | POA: Diagnosis not present

## 2016-12-22 DIAGNOSIS — Z3689 Encounter for other specified antenatal screening: Secondary | ICD-10-CM | POA: Insufficient documentation

## 2016-12-22 DIAGNOSIS — Z3480 Encounter for supervision of other normal pregnancy, unspecified trimester: Secondary | ICD-10-CM

## 2016-12-22 NOTE — MAU Note (Signed)
[facility] pt escorted there.

## 2016-12-25 ENCOUNTER — Encounter (HOSPITAL_COMMUNITY): Payer: Self-pay

## 2016-12-25 ENCOUNTER — Inpatient Hospital Stay (HOSPITAL_COMMUNITY)
Admission: AD | Admit: 2016-12-25 | Discharge: 2016-12-27 | DRG: 775 | Disposition: A | Discharge status: Home / Self Care | Payer: Medicaid Other | Source: Ambulatory Visit | Attending: Obstetrics & Gynecology | Admitting: Obstetrics & Gynecology

## 2016-12-25 DIAGNOSIS — O99824 Streptococcus B carrier state complicating childbirth: Secondary | ICD-10-CM | POA: Diagnosis present

## 2016-12-25 DIAGNOSIS — Z3493 Encounter for supervision of normal pregnancy, unspecified, third trimester: Secondary | ICD-10-CM | POA: Diagnosis present

## 2016-12-25 DIAGNOSIS — O321XX0 Maternal care for breech presentation, not applicable or unspecified: Secondary | ICD-10-CM

## 2016-12-25 DIAGNOSIS — O3503X0 Maternal care for (suspected) central nervous system malformation or damage in fetus, choroid plexus cysts, not applicable or unspecified: Secondary | ICD-10-CM

## 2016-12-25 DIAGNOSIS — O350XX0 Maternal care for (suspected) central nervous system malformation in fetus, not applicable or unspecified: Secondary | ICD-10-CM

## 2016-12-25 DIAGNOSIS — Z833 Family history of diabetes mellitus: Secondary | ICD-10-CM | POA: Diagnosis not present

## 2016-12-25 DIAGNOSIS — Z8249 Family history of ischemic heart disease and other diseases of the circulatory system: Secondary | ICD-10-CM | POA: Diagnosis not present

## 2016-12-25 DIAGNOSIS — Z3A39 39 weeks gestation of pregnancy: Secondary | ICD-10-CM

## 2016-12-25 LAB — CBC
HCT: 36.2 % (ref 36.0–46.0)
Hemoglobin: 12.2 g/dL (ref 12.0–15.0)
MCH: 29.6 pg (ref 26.0–34.0)
MCHC: 33.7 g/dL (ref 30.0–36.0)
MCV: 87.9 fL (ref 78.0–100.0)
PLATELETS: 331 10*3/uL (ref 150–400)
RBC: 4.12 MIL/uL (ref 3.87–5.11)
RDW: 14.2 % (ref 11.5–15.5)
WBC: 11.1 10*3/uL — ABNORMAL HIGH (ref 4.0–10.5)

## 2016-12-25 LAB — TYPE AND SCREEN
ABO/RH(D): B POS
Antibody Screen: NEGATIVE

## 2016-12-25 LAB — ABO/RH: ABO/RH(D): B POS

## 2016-12-25 LAB — RPR: RPR Ser Ql: UNDETERMINED

## 2016-12-25 MED ORDER — DIPHENHYDRAMINE HCL 25 MG PO CAPS: 25.0000 mg | ORAL_CAPSULE | Freq: Four times a day (QID) | ORAL | Status: DC | PRN

## 2016-12-25 MED ORDER — LACTATED RINGERS IV SOLN: 500.0000 mL | INTRAVENOUS | Status: DC | PRN

## 2016-12-25 MED ORDER — FENTANYL CITRATE (PF) 100 MCG/2ML IJ SOLN: 50.0000 ug | INTRAMUSCULAR | Status: DC | PRN

## 2016-12-25 MED ORDER — LIDOCAINE HCL (PF) 1 % IJ SOLN
30.0000 mL | INTRAMUSCULAR | Status: DC | PRN
  Filled 2016-12-25: qty 30

## 2016-12-25 MED ORDER — SIMETHICONE 80 MG PO CHEW: 80.0000 mg | CHEWABLE_TABLET | ORAL | Status: DC | PRN

## 2016-12-25 MED ORDER — SODIUM CHLORIDE 0.9 % IV SOLN
2.0000 g | Freq: Once | INTRAVENOUS | Status: AC
  Administered 2016-12-25: 2 g via INTRAVENOUS
  Filled 2016-12-25: qty 2000

## 2016-12-25 MED ORDER — BENZOCAINE-MENTHOL 20-0.5 % EX AERO: 1.0000 "application " | INHALATION_SPRAY | CUTANEOUS | Status: DC | PRN

## 2016-12-25 MED ORDER — ONDANSETRON HCL 4 MG/2ML IJ SOLN: 4.0000 mg | Freq: Four times a day (QID) | INTRAMUSCULAR | Status: DC | PRN

## 2016-12-25 MED ORDER — SENNOSIDES-DOCUSATE SODIUM 8.6-50 MG PO TABS
2.0000 | ORAL_TABLET | ORAL | Status: DC
  Administered 2016-12-26 – 2016-12-27 (×2): 2 via ORAL
  Filled 2016-12-25 (×2): qty 2

## 2016-12-25 MED ORDER — LACTATED RINGERS IV SOLN
INTRAVENOUS | Status: DC
  Administered 2016-12-25: 08:00:00 via INTRAVENOUS

## 2016-12-25 MED ORDER — ONDANSETRON HCL 4 MG/2ML IJ SOLN: 4.0000 mg | INTRAMUSCULAR | Status: DC | PRN

## 2016-12-25 MED ORDER — TERBUTALINE SULFATE 1 MG/ML IJ SOLN
0.2500 mg | Freq: Once | INTRAMUSCULAR | Status: DC | PRN
  Filled 2016-12-25: qty 1

## 2016-12-25 MED ORDER — PRENATAL MULTIVITAMIN CH
1.0000 | ORAL_TABLET | Freq: Every day | ORAL | Status: DC
  Administered 2016-12-26 – 2016-12-27 (×2): 1 via ORAL
  Filled 2016-12-25 (×2): qty 1

## 2016-12-25 MED ORDER — OXYTOCIN 40 UNITS IN LACTATED RINGERS INFUSION - SIMPLE MED
INTRAVENOUS | Status: AC
  Filled 2016-12-25: qty 1000

## 2016-12-25 MED ORDER — OXYCODONE-ACETAMINOPHEN 5-325 MG PO TABS: 2.0000 | ORAL_TABLET | ORAL | Status: DC | PRN

## 2016-12-25 MED ORDER — LIDOCAINE HCL (PF) 1 % IJ SOLN
INTRAMUSCULAR | Status: AC
  Filled 2016-12-25: qty 30

## 2016-12-25 MED ORDER — ACETAMINOPHEN 325 MG PO TABS: 650.0000 mg | ORAL_TABLET | ORAL | Status: DC | PRN

## 2016-12-25 MED ORDER — WITCH HAZEL-GLYCERIN EX PADS: 1.0000 "application " | MEDICATED_PAD | CUTANEOUS | Status: DC | PRN

## 2016-12-25 MED ORDER — TETANUS-DIPHTH-ACELL PERTUSSIS 5-2.5-18.5 LF-MCG/0.5 IM SUSP: 0.5000 mL | Freq: Once | INTRAMUSCULAR | Status: DC

## 2016-12-25 MED ORDER — ACETAMINOPHEN 325 MG PO TABS
650.0000 mg | ORAL_TABLET | ORAL | Status: DC | PRN
  Administered 2016-12-25: 650 mg via ORAL
  Filled 2016-12-25: qty 2

## 2016-12-25 MED ORDER — DIBUCAINE 1 % RE OINT: 1.0000 "application " | TOPICAL_OINTMENT | RECTAL | Status: DC | PRN

## 2016-12-25 MED ORDER — FLEET ENEMA 7-19 GM/118ML RE ENEM: 1.0000 | ENEMA | RECTAL | Status: DC | PRN

## 2016-12-25 MED ORDER — OXYTOCIN 40 UNITS IN LACTATED RINGERS INFUSION - SIMPLE MED
2.5000 [IU]/h | INTRAVENOUS | Status: DC
  Filled 2016-12-25: qty 1000

## 2016-12-25 MED ORDER — ONDANSETRON HCL 4 MG PO TABS: 4.0000 mg | ORAL_TABLET | ORAL | Status: DC | PRN

## 2016-12-25 MED ORDER — IBUPROFEN 600 MG PO TABS
600.0000 mg | ORAL_TABLET | Freq: Four times a day (QID) | ORAL | Status: DC
  Administered 2016-12-25 – 2016-12-27 (×9): 600 mg via ORAL
  Filled 2016-12-25 (×9): qty 1

## 2016-12-25 MED ORDER — SOD CITRATE-CITRIC ACID 500-334 MG/5ML PO SOLN: 30.0000 mL | ORAL | Status: DC | PRN

## 2016-12-25 MED ORDER — OXYTOCIN BOLUS FROM INFUSION
500.0000 mL | Freq: Once | INTRAVENOUS | Status: AC
  Administered 2016-12-25: 500 mL via INTRAVENOUS

## 2016-12-25 MED ORDER — COCONUT OIL OIL: 1.0000 "application " | TOPICAL_OIL | Status: DC | PRN

## 2016-12-25 MED ORDER — OXYCODONE-ACETAMINOPHEN 5-325 MG PO TABS: 1.0000 | ORAL_TABLET | ORAL | Status: DC | PRN

## 2016-12-25 MED ORDER — ZOLPIDEM TARTRATE 5 MG PO TABS: 5.0000 mg | ORAL_TABLET | Freq: Every evening | ORAL | Status: DC | PRN

## 2016-12-25 NOTE — H&P (Signed)
LABOR AND DELIVERY ADMISSION HISTORY AND PHYSICAL NOTE  Rosebud Koenen is a 36 y.o. female G5P2002 with IUP at [redacted]w[redacted]d by 11 week [place] presenting for spontaneous labor.  Contractions started this morning around 2AM.  No LOF or vaginal bleeding prior to admission.  Prenatal History/Complications:  Past Medical History: Past Medical History:  Diagnosis Date  . Medical history non-contributory     Past Surgical History: Past Surgical History:  Procedure Laterality Date  . NO PAST SURGERIES      Obstetrical History: OB History    Gravida Para Term Preterm AB Living   3 2 2     2    SAB TAB Ectopic Multiple Live Births           2      Social History: Social History   Social History  . Marital status: Married    Spouse name: N/A  . Number of children: N/A  . Years of education: N/A   Social History Main Topics  . Smoking status: Never Smoker  . Smokeless tobacco: Never Used  . Alcohol use No  . Drug use: No  . Sexual activity: Yes   Other Topics Concern  . None   Social History Narrative  . None    Family History: Family History  Problem Relation Age of Onset  . Cancer Paternal Grandfather   . Hypertension Paternal Grandfather   . Asthma Paternal Grandfather   . Diabetes Paternal Grandfather   . Heart disease Paternal Grandfather   . Hearing loss Paternal Grandfather   . Stroke Paternal Grandfather   . Alcohol abuse Neg Hx   . Arthritis Neg Hx   . Birth defects Neg Hx   . COPD Neg Hx   . Depression Neg Hx   . Drug abuse Neg Hx   . Early death Neg Hx   . Hyperlipidemia Neg Hx   . Kidney disease Neg Hx   . Learning disabilities Neg Hx   . Mental illness Neg Hx   . Mental retardation Neg Hx   . Miscarriages / Stillbirths Neg Hx   . Vision loss Neg Hx   . Varicose Veins Neg Hx     Allergies: No Known Allergies  Prescriptions Prior to Admission  Medication Sig Dispense Refill Last Dose  . acetaminophen (TYLENOL) 500 MG tablet Take 500 mg by mouth  every 6 (six) hours as needed for headache.   12/15/2016 at Unknown time  . prenatal vitamin w/FE, FA (NATACHEW) 29-1 MG CHEW chewable tablet Chew 1 tablet by mouth daily at 12 noon. 30 tablet 10 12/16/2016 at Unknown time     Review of Systems   All systems reviewed and negative except as stated in HPI  Blood pressure 117/63, pulse 90, temperature 97.8 F (36.6 C), temperature source Oral, resp. rate 18, height 5\' 3"  (1.6 m), weight 160 lb (72.6 kg), last menstrual period 03/29/2016. General appearance: alert, cooperative and appears stated age Lungs: no respiratory distress Heart: regular rate, pulses 2+ Abdomen: soft, non-tender; gravid Extremities: No calf swelling or tenderness Presentation: cephalic Fetal monitoring: category II - baseline 130-135, moderate variability, no accels, non-recurrent lates, early decels Uterine activity: q69min  Dilation: 7 Effacement (%): 70 Station: -2 Exam by:: Belenda Cruise RNC    Prenatal labs: ABO, Rh: B/Positive/-- (09/05 1641) Antibody: Negative (09/05 1641) Rubella: immune RPR: Non Reactive (01/04 1110)  HBsAg: Negative (09/05 1641)  HIV: Non Reactive (01/04 1110)  GBS: Positive (02/20 0000)  2 hr Glucola: 78/110/85 (wnl)  Genetic screening:  neg Anatomy US: female, wnl  Prenatal Transfer Tool  Maternal Diabetes: No Genetic Screening: Normal Maternal Ultrasounds/Referrals: Normal Fetal Ultrasounds or other Referrals:  None Maternal Substance Abuse:  No Significant Maternal Medications:  None Significant Maternal Lab Results: Lab values include: Group B Strep positive  Results for orders placed or performed during the hospital encounter of 12/25/16 (from the past 24 hour(s))  CBC   Collection Time: 12/25/16  8:00 AM  Result Value Ref Range   WBC 11.1 (H) 4.0 - 10.5 K/uL   RBC 4.12 3.87 - 5.11 MIL/uL   Hemoglobin 12.2 12.0 - 15.0 g/dL   HCT 62.136.2 30.836.0 - 65.746.0 %   MCV 87.9 78.0 - 100.0 fL   MCH 29.6 26.0 - 34.0 pg   MCHC 33.7  30.0 - 36.0 g/dL   RDW 84.614.2 96.211.5 - 95.215.5 %   Platelets 331 150 - 400 K/uL    Patient Active Problem List   Diagnosis Date Noted  . Normal labor 12/25/2016  . Breech presentation on examination 12/17/2016  . Positive GBS test 11/26/2016  . Choroid plexus cyst of fetus affecting care of mother, antepartum 08/11/2016  . Supervision of other normal pregnancy, antepartum 07/07/2016  . Prenatal care 06/08/2016    Assessment: Clerance LavBalaimnao Delashmit is a 36 y.o. G3P2002 at 1735w6d here for spontaneous labor  #Labor: spontaneous, active, s/p vaginal delivery #GBS+:  Ampicillin started 40 minutes before delivery #Pain: Did not have time for epidural; motrin 600 mg postpartum prn #FWB: Category II strip during 2nd stage of labor; apgars 9 & 9 #ID:  none #MOF: breast #MOC: undecided #Circ:  Desired, outpatient  Charlsie MerlesJulia Rhoden 12/25/2016, 8:56 AM   OB FELLOW HISTORY AND PHYSICAL ATTESTATION  I have seen and examined this patient; I agree with above documentation in the resident's note.    Jen MowElizabeth Irianna Gilday, DO [place] Fellow 12/25/2016

## 2016-12-25 NOTE — MAU Note (Signed)
Report given to Dr. Vear ClockPhillips, patient G3P2 5729w6d 5-6cm, GBS positive, MD to place admission orders.

## 2016-12-25 NOTE — Progress Notes (Signed)
Spoke with lab and was informed that the patient's RPR was not a sufficient quantity. Called Charlsie MerlesJulia Rhoden, MD and let her know. New orders for another RPR to be drawn. Sherald BargeMatthews, Domique Clapper L

## 2016-12-25 NOTE — Lactation Note (Signed)
This note was copied from a baby's chart. Lactation Consultation Note  Patient Name: [name] ZOXWR'UToday's Date: 12/25/2016 Reason for consult: Initial assessment  Assisted with positioning and latch with this 3 hr old baby born at 5222w6d, 7 lbs. 1.9 oz.  Mom breastfed her 2 other children for a year, 238 and 35 yrs old now.  Assisted with laid back position after demonstrating hand expression, colostrum easily expressed.  Baby latched easily and fed for 10 minutes.  Mom then latched baby onto right breast sitting up in cross cradle hold.  Basics reviewed with Mom.  Encouraged STS, and cue based feedings.  Mom to ask for help as needed, and Lactation to follow up 12/26/16. Brochure left in room.  Informed her of IP and OP lactation services available.     Michele Everett, Michele Everett 12/25/2016, 12:19 PM

## 2016-12-25 NOTE — MAU Note (Signed)
Pt presents for Labor Eval. Contracting since 2am. No leaking of fluid. Pt states there is some dark brown discharge when she wipes.

## 2016-12-26 LAB — RPR: RPR: NONREACTIVE

## 2016-12-26 NOTE — Lactation Note (Signed)
This note was copied from a baby's chart. Lactation Consultation Note  Patient Name: Michele Everett WUJWJ'XToday's Date: 12/26/2016 Reason for consult: Follow-up assessment  Baby 32 hours old. Patients bedside nurse, Judeth CornfieldStephanie, RN reports that mom had intended to give breast and bottle/formula before she delivered--and that this is how she fed her other children.   Maternal Data Formula Feeding for Exclusion: No Has patient been taught Hand Expression?: Yes Does the patient have breastfeeding experience prior to this delivery?: Yes  Feeding Feeding Type: Breast Fed  LATCH Score/Interventions Latch: Grasps breast easily, tongue down, lips flanged, rhythmical sucking.  Audible Swallowing: A few with stimulation  Type of Nipple: Everted at rest and after stimulation  Comfort (Breast/Nipple): Soft / non-tender     Hold (Positioning): No assistance needed to correctly position infant at breast.  LATCH Score: 9  Lactation Tools Discussed/Used     Consult Status Consult Status: PRN    Sherlyn HayJennifer D Jelina Paulsen 12/26/2016, 4:53 PM

## 2016-12-26 NOTE — Progress Notes (Signed)
POSTPARTUM PROGRESS NOTE  Post Partum Day #1 Subjective:  Michele Everett is a 36 y.o. [id number] 8073w6d s/p NSVD.  No acute events overnight.  Pt denies problems with ambulating, voiding or po intake.  She denies nausea or vomiting.  Pain is well controlled.  She has had flatus. She has not had bowel movement.  Lochia Minimal.   Objective: Blood pressure 109/69, pulse 86, temperature 97.8 F (36.6 C), temperature source Oral, resp. rate 18, height 5\' 3"  (1.6 m), weight 160 lb (72.6 kg), last menstrual period 03/29/2016, SpO2 100 %, unknown if currently breastfeeding.  Physical Exam:  General: alert, cooperative and no distress Lochia:normal flow Chest: CTAB Heart: RRR no m/r/g Abdomen: +BS, soft, nontender,  Uterine Fundus: firm, below umbilicus, nontender DVT Evaluation: No calf swelling or tenderness Extremities: no edema   Recent Labs  12/25/16 0800  HGB 12.2  HCT 36.2    Assessment/Plan:  ASSESSMENT: Michele LavBalaimnao Mccary is a 36 y.o. [id number] 8473w6d s/p NSVD  Plan for discharge tomorrow   LOS: 1 day   Gwenevere AbbotNimeka Phillip, MD Family Medicine Resident, PGY-2 [facility]  12/26/2016, 7:20 AM

## 2016-12-26 NOTE — Lactation Note (Signed)
This note was copied from a baby's chart. Lactation Consultation Note: Experienced BF mom has baby latched to the breast when I went into room. Reports he has been feeding for 10 min, no pain with nursing. Reports breasts are feeling a little heavier this afternoon. Reports he fed a lot through the night. No questions at present. To call for assist prn  Patient Name: Michele Everett ZOXWR'UToday's Date: 12/26/2016 Reason for consult: Follow-up assessment   Maternal Data Formula Feeding for Exclusion: No Has patient been taught Hand Expression?: Yes Does the patient have breastfeeding experience prior to this delivery?: Yes  Feeding Feeding Type: Breast Fed  LATCH Score/Interventions Latch: Grasps breast easily, tongue down, lips flanged, rhythmical sucking.  Audible Swallowing: A few with stimulation  Type of Nipple: Everted at rest and after stimulation  Comfort (Breast/Nipple): Soft / non-tender     Hold (Positioning): No assistance needed to correctly position infant at breast.  LATCH Score: 9  Lactation Tools Discussed/Used     Consult Status Consult Status: PRN    Pamelia HoitWeeks, David Towson D 12/26/2016, 2:23 PM

## 2016-12-27 MED ORDER — IBUPROFEN 600 MG PO TABS: 600.0000 mg | ORAL_TABLET | Freq: Four times a day (QID) | ORAL | 2 refills | Status: DC

## 2016-12-27 NOTE — Lactation Note (Signed)
This note was copied from a baby's chart. Lactation Consultation Note: follow up assessment. Mother reports that infant is feeding well. Mothers breast are filling. Mother was given a hand pump and advised to pump for 15 mins on each breast and supplement infant with EBM instead of formula. #24 flange fits well. Mother advised to massage breast and feed infant well to decrease swelling in breast. Mother is aware of available lactation services and community support.  Patient Name: Boy Clerance LavBalaimnao Andal ZOXWR'UToday's Date: 12/27/2016     Maternal Data    Feeding Feeding Type: Breast Fed Length of feed: 10 min  LATCH Score/Interventions                      Lactation Tools Discussed/Used     Consult Status      Michel BickersKendrick, Zandrea Kenealy McCoy 12/27/2016, 10:28 AM

## 2016-12-27 NOTE — Discharge Summary (Signed)
OB Discharge Summary     Patient Name: Michele Everett DOB: 06-Nov-1980 MRN: [id number]  Date of admission: 12/25/2016 Delivering MD: Genice Rouge   Date of discharge: 12/27/2016  Admitting diagnosis: in labor Intrauterine pregnancy: [redacted]w[redacted]d     Secondary diagnosis:  Active Problems:   Normal labor  Additional problems: Successful version on 12/17/16.      Discharge diagnosis: Term Pregnancy Delivered                                                                                                Post partum procedures:n/a  Augmentation: none  Complications: None  Hospital course:  Onset of Labor With Vaginal Delivery     36 y.o. yo G3P3003 at [redacted]w[redacted]d was admitted in Active Labor on 12/25/2016. Patient had an uncomplicated labor course as follows:  Membrane Rupture Time/Date: 8:36 AM ,12/25/2016   Intrapartum Procedures: Episiotomy: None [1]                                         Lacerations:  None [1]  Patient had a delivery of a Viable infant. 12/25/2016  Information for the patient's newborn:  Doreen, Garretson [[id number]]  Delivery Method: Vaginal, Spontaneous Delivery (Filed from Delivery Summary)    Pateint had an uncomplicated postpartum course.  She is ambulating, tolerating a regular diet, passing flatus, and urinating well. Patient is discharged home in stable condition on 12/27/16.   Physical exam  Vitals:   12/25/16 1615 12/25/16 1832 12/26/16 0530 12/27/16 0537  BP: 109/65 114/62 109/69 108/63  Pulse: 86 81 86 85  Resp: 16 16 18 18   Temp: 98.2 F (36.8 C) 98.2 F (36.8 C) 97.8 F (36.6 C) 98 F (36.7 C)  TempSrc: Oral Oral Oral   SpO2:  100%    Weight:      Height:       General: alert, cooperative and no distress Lochia: appropriate Uterine Fundus: firm Incision: N/A DVT Evaluation: No evidence of DVT seen on physical exam. No cords or calf tenderness. No significant calf/ankle edema. Labs: Lab Results  Component Value Date   WBC 11.1 (H)  12/25/2016   HGB 12.2 12/25/2016   HCT 36.2 12/25/2016   MCV 87.9 12/25/2016   PLT 331 12/25/2016   No flowsheet data found.  Discharge instruction: per After Visit Summary and "Baby and Me Booklet".  After visit meds:  Allergies as of 12/27/2016   No Known Allergies     Medication List    TAKE these medications   acetaminophen 500 MG tablet Commonly known as:  TYLENOL Take 500 mg by mouth every 6 (six) hours as needed for mild pain, moderate pain, fever or headache.   ibuprofen 600 MG tablet Commonly known as:  ADVIL,MOTRIN Take 1 tablet (600 mg total) by mouth every 6 (six) hours.   NATACHEW 28-1 MG Chew Chew 1 tablet by mouth every evening.       Diet: routine diet  Activity: Advance as tolerated. Pelvic rest  for 6 weeks.   Outpatient follow up:4 weeks Follow up Appt:No future appointments. Follow up Visit:No Follow-up on file.  Postpartum contraception: Natural Family Planning, Condoms and declined contraception at this time  Newborn Data: Live born female  Birth Weight: 7 lb 1.9 oz (3230 g) APGAR: 9, 9  Baby Feeding: Breast Disposition:home with mother   12/27/2016 Roe Coombsachelle A Randell Detter, CNM

## 2016-12-27 NOTE — Progress Notes (Signed)
Post Partum Day # Subjective: no complaints, up ad lib, voiding and tolerating PO  Objective: Blood pressure 108/63, pulse 85, temperature 98 F (36.7 C), resp. rate 18, height 5\' 3"  (1.6 m), weight 160 lb (72.6 kg), last menstrual period 03/29/2016, SpO2 100 %, unknown if currently breastfeeding.  Physical Exam:  General: alert, cooperative and no distress Lochia: appropriate Uterine Fundus: firm Incision: none DVT Evaluation: No evidence of DVT seen on physical exam. No cords or calf tenderness. No significant calf/ankle edema.   Recent Labs  12/25/16 0800  HGB 12.2  HCT 36.2    Assessment/Plan: Discharge home, Breastfeeding and Contraception declined   LOS: 2 days   Roe CoombsRachelle A Denney, CNM 12/27/2016, 7:24 AM

## 2017-06-22 ENCOUNTER — Ambulatory Visit (INDEPENDENT_AMBULATORY_CARE_PROVIDER_SITE_OTHER): Payer: Medicaid Other

## 2017-06-22 DIAGNOSIS — Z309 Encounter for contraceptive management, unspecified: Secondary | ICD-10-CM

## 2017-06-22 DIAGNOSIS — N912 Amenorrhea, unspecified: Secondary | ICD-10-CM

## 2017-06-22 LAB — POCT URINE PREGNANCY: Preg Test, Ur: POSITIVE — AB

## 2017-06-22 NOTE — Progress Notes (Signed)
Michele Everett presents today for UPT. She complains of missing her last cycle. Last LMP: 05/07/17    OBJECTIVE: Appears well, in no apparent distress.  OB History    Gravida Para Term Preterm AB Living   SAB TAB Ectopic Multiple Live Births         0 3     Home UPT Result: Positive In-Office UPT result: Positive I have reviewed the patient's medical, obstetrical, social, and family histories, and medications.   ASSESSMENT: Positive pregnancy test  PLAN Prenatal care to be completed at:

## 2017-07-04 ENCOUNTER — Telehealth: Payer: Self-pay

## 2017-07-04 NOTE — Telephone Encounter (Signed)
Returned call and patient wanted to cancel NOB appt and stated that she did not want to re-schedule.

## 2017-07-25 ENCOUNTER — Encounter: Payer: Self-pay | Admitting: Obstetrics and Gynecology

## 2017-09-01 ENCOUNTER — Ambulatory Visit: Payer: Medicaid Other | Admitting: Certified Nurse Midwife

## 2017-09-07 ENCOUNTER — Other Ambulatory Visit (HOSPITAL_COMMUNITY)
Admission: RE | Admit: 2017-09-07 | Discharge: 2017-09-07 | Disposition: A | Discharge status: Home / Self Care | Payer: Medicaid Other | Source: Ambulatory Visit | Attending: Certified Nurse Midwife | Admitting: Certified Nurse Midwife

## 2017-09-07 ENCOUNTER — Encounter: Payer: Self-pay | Admitting: Certified Nurse Midwife

## 2017-09-07 ENCOUNTER — Ambulatory Visit (INDEPENDENT_AMBULATORY_CARE_PROVIDER_SITE_OTHER): Payer: Medicaid Other | Admitting: Certified Nurse Midwife

## 2017-09-07 VITALS — BP 104/71 | HR 78 | Wt 150.2 lb

## 2017-09-07 DIAGNOSIS — Z309 Encounter for contraceptive management, unspecified: Secondary | ICD-10-CM

## 2017-09-07 DIAGNOSIS — N926 Irregular menstruation, unspecified: Secondary | ICD-10-CM

## 2017-09-07 DIAGNOSIS — Z01419 Encounter for gynecological examination (general) (routine) without abnormal findings: Secondary | ICD-10-CM | POA: Insufficient documentation

## 2017-09-07 DIAGNOSIS — Z30013 Encounter for initial prescription of injectable contraceptive: Secondary | ICD-10-CM

## 2017-09-07 DIAGNOSIS — Z9889 Other specified postprocedural states: Secondary | ICD-10-CM

## 2017-09-07 LAB — POCT URINE PREGNANCY: PREG TEST UR: NEGATIVE

## 2017-09-07 MED ORDER — MEDROXYPROGESTERONE ACETATE 150 MG/ML IM SUSP
150.0000 mg | Freq: Once | INTRAMUSCULAR | Status: AC
Start: 2017-09-07 — End: 2017-09-07
  Administered 2017-09-07: 150 mg via INTRAMUSCULAR

## 2017-09-07 MED ORDER — NATACHEW 28-1 MG PO CHEW: 1.0000 | CHEWABLE_TABLET | Freq: Every day | ORAL | 12 refills | Status: AC

## 2017-09-07 MED ORDER — MEDROXYPROGESTERONE ACETATE 150 MG/ML IM SUSP: 150.0000 mg | INTRAMUSCULAR | 4 refills | Status: AC

## 2017-09-07 NOTE — Addendum Note (Signed)
Addended by: Marya LandryFOSTER, Herta Hink D on: 09/07/2017 03:56 PM   Modules accepted: Orders

## 2017-09-07 NOTE — Progress Notes (Signed)
Subjective:        Michele Everett is a 36 y.o. female here for a routine exam.  Current complaints: irregular bleeding, since abortion 3 months ago.  Is currently breastfeeding.    Desires birth control.  Options discussed.  Reviewed all forms of birth control options available including abstinence; over the counter/barrier methods; hormonal contraceptive medication including pill, patch, ring, injection,contraceptive implant; hormonal and nonhormonal IUDs; permanent sterilization options including vasectomy and the various tubal sterilization modalities. Risks and benefits reviewed.  Questions were answered.  Information was given to patient to review.    Personal health questionnaire:  Is patient Ashkenazi Jewish, have a family history of breast and/or ovarian cancer: no Is there a family history of uterine cancer diagnosed at age < 56, gastrointestinal cancer, urinary tract cancer, family member who is a Personnel officer syndrome-associated carrier: no Is the patient overweight and hypertensive, family history of diabetes, personal history of gestational diabetes, preeclampsia or PCOS: no Is patient over 63, have PCOS,  family history of premature CHD under age 83, diabetes, smoke, have hypertension or peripheral artery disease:  no At any time, has a partner hit, kicked or otherwise hurt or frightened you?: no Over the past 2 weeks, have you felt down, depressed or hopeless?: no Over the past 2 weeks, have you felt little interest or pleasure in doing things?:no   Gynecologic History Patient's last menstrual period was 06/08/2017 (approximate). Contraception: none Last Pap: 06/08/16. Results were: ASCUS, neg HPV Last mammogram: n/a <40, no significant hx.   Obstetric History OB History  Gravida Para Term Preterm AB Living  4 3 3   1 3   SAB TAB Ectopic Multiple Live Births    1   0 3    # Outcome Date GA Lbr Len/2nd Weight Sex Delivery Anes PTL Lv  4 TAB 06/08/17          3 Term  12/25/16 [redacted]w[redacted]d 02:06 / 00:05 7 lb 1.9 oz (3.23 kg) M Vag-Spont None  LIV  2 Term      Vag-Spont     1 Term      Vag-Spont         Past Medical History:  Diagnosis Date  . Medical history non-contributory     Past Surgical History:  Procedure Laterality Date  . NO PAST SURGERIES    . THERAPEUTIC ABORTION       Current Outpatient Medications:  .  acetaminophen (TYLENOL) 500 MG tablet, Take 500 mg by mouth every 6 (six) hours as needed for mild pain, moderate pain, fever or headache. , Disp: , Rfl:  .  medroxyPROGESTERone (DEPO-PROVERA) 150 MG/ML injection, Inject 1 mL (150 mg total) into the muscle every 3 (three) months., Disp: 1 mL, Rfl: 4 .  Prenatal Vit-Fe Fum-Fe Bisg-FA (NATACHEW) 28-1 MG CHEW, Chew 1 tablet by mouth daily., Disp: 30 tablet, Rfl: 12 No Known Allergies  Social History   Tobacco Use  . Smoking status: Never Smoker  . Smokeless tobacco: Never Used  Substance Use Topics  . Alcohol use: No    Family History  Problem Relation Age of Onset  . Cancer Paternal Grandfather   . Hypertension Paternal Grandfather   . Asthma Paternal Grandfather   . Diabetes Paternal Grandfather   . Heart disease Paternal Grandfather   . Hearing loss Paternal Grandfather   . Stroke Paternal Grandfather   . Alcohol abuse Neg Hx   . Arthritis Neg Hx   . Birth defects Neg Hx   .  COPD Neg Hx   . Depression Neg Hx   . Drug abuse Neg Hx   . Early death Neg Hx   . Hyperlipidemia Neg Hx   . Kidney disease Neg Hx   . Learning disabilities Neg Hx   . Mental illness Neg Hx   . Mental retardation Neg Hx   . Miscarriages / Stillbirths Neg Hx   . Vision loss Neg Hx   . Varicose Veins Neg Hx       Review of Systems  Constitutional: negative for fatigue and weight loss Respiratory: negative for cough and wheezing Cardiovascular: negative for chest pain, fatigue and palpitations Gastrointestinal: negative for abdominal pain and change in bowel habits Musculoskeletal:negative for  myalgias Neurological: negative for gait problems and tremors Behavioral/Psych: negative for abusive relationship, depression Endocrine: negative for temperature intolerance    Genitourinary:negative for abnormal menstrual periods, genital lesions, hot flashes, sexual problems and vaginal discharge Integument/breast: negative for breast lump, breast tenderness, nipple discharge and skin lesion(s)    Objective:       BP 104/71   Pulse 78   Wt 150 lb 3.2 oz (68.1 kg)   LMP 06/08/2017 (Approximate)   Breastfeeding? No   BMI 26.61 kg/m  General:   alert  Skin:   no rash or abnormalities  Lungs:   clear to auscultation bilaterally  Heart:   regular rate and rhythm, S1, S2 normal, no murmur, click, rub or gallop  Breasts:   normal without suspicious masses, skin or nipple changes or axillary nodes  Abdomen:  normal findings: no organomegaly, soft, non-tender and no hernia  Pelvis:  External genitalia: normal general appearance Urinary system: urethral meatus normal and bladder without fullness, nontender Vaginal: normal without tenderness, induration or masses Cervix: normal appearance Adnexa: normal bimanual exam Uterus: anteverted and non-tender, normal size   Lab Review Urine pregnancy test Labs reviewed yes Radiologic studies reviewed no  50% of 30 min visit spent on counseling and coordination of care.   Assessment & Plan    Healthy female exam.    1. Irregular menses     - POCT urine pregnancy  2. History of elective abortion     3 months ago.   3. Breast feeding status of mother     - Prenatal Vit-Fe Fum-Fe Bisg-FA (NATACHEW) 28-1 MG CHEW; Chew 1 tablet by mouth daily.  Dispense: 30 tablet; Refill: 12  4. Encounter for initial prescription of injectable contraceptive    Depo injection given in office.  - medroxyPROGESTERone (DEPO-PROVERA) 150 MG/ML injection; Inject 1 mL (150 mg total) into the muscle every 3 (three) months.  Dispense: 1 mL; Refill: 4    Education reviewed: calcium supplements, depression evaluation, low fat, low cholesterol diet, safe sex/STD prevention, self breast exams, skin cancer screening and weight bearing exercise. Contraception: Depo-Provera injections. Follow up in: 3 months. birth control surveillance.   Meds ordered this encounter  Medications  . medroxyPROGESTERone (DEPO-PROVERA) 150 MG/ML injection    Sig: Inject 1 mL (150 mg total) into the muscle every 3 (three) months.    Dispense:  1 mL    Refill:  4  . Prenatal Vit-Fe Fum-Fe Bisg-FA (NATACHEW) 28-1 MG CHEW    Sig: Chew 1 tablet by mouth daily.    Dispense:  30 tablet    Refill:  12  . medroxyPROGESTERone (DEPO-PROVERA) injection 150 mg   Orders Placed This Encounter  Procedures  . POCT urine pregnancy   Need to obtain previous records for recent abortion  Possible management options include: Nexplanon/IUD Follow up as needed.

## 2017-09-08 ENCOUNTER — Ambulatory Visit: Payer: Medicaid Other | Admitting: Certified Nurse Midwife

## 2017-09-08 LAB — CERVICOVAGINAL ANCILLARY ONLY
BACTERIAL VAGINITIS: POSITIVE — AB
CANDIDA VAGINITIS: POSITIVE — AB
CHLAMYDIA, DNA PROBE: NEGATIVE
NEISSERIA GONORRHEA: NEGATIVE
TRICH (WINDOWPATH): NEGATIVE

## 2017-09-09 ENCOUNTER — Other Ambulatory Visit: Payer: Self-pay | Admitting: Certified Nurse Midwife

## 2017-09-09 DIAGNOSIS — B3731 Acute candidiasis of vulva and vagina: Secondary | ICD-10-CM

## 2017-09-09 DIAGNOSIS — B373 Candidiasis of vulva and vagina: Secondary | ICD-10-CM

## 2017-09-09 DIAGNOSIS — N760 Acute vaginitis: Principal | ICD-10-CM

## 2017-09-09 DIAGNOSIS — B9689 Other specified bacterial agents as the cause of diseases classified elsewhere: Secondary | ICD-10-CM

## 2017-09-09 LAB — CYTOLOGY - PAP
DIAGNOSIS: NEGATIVE
HPV (WINDOPATH): NOT DETECTED

## 2017-09-09 MED ORDER — TERCONAZOLE 0.8 % VA CREA: 1.0000 | TOPICAL_CREAM | Freq: Every day | VAGINAL | 0 refills | Status: DC

## 2017-09-09 MED ORDER — METRONIDAZOLE 500 MG PO TABS: 500.0000 mg | ORAL_TABLET | Freq: Two times a day (BID) | ORAL | 0 refills | Status: AC

## 2017-09-09 MED ORDER — FLUCONAZOLE 200 MG PO TABS: 200.0000 mg | ORAL_TABLET | Freq: Once | ORAL | 0 refills | Status: AC

## 2017-11-30 ENCOUNTER — Ambulatory Visit: Payer: Medicaid Other

## 2017-12-01 ENCOUNTER — Telehealth: Payer: Self-pay | Admitting: *Deleted

## 2017-12-01 NOTE — Telephone Encounter (Signed)
Lft vmail for patient to call back and reschedule DEPO appt or let us know her plans

## 2017-12-28 ENCOUNTER — Ambulatory Visit (INDEPENDENT_AMBULATORY_CARE_PROVIDER_SITE_OTHER): Payer: Self-pay

## 2017-12-28 DIAGNOSIS — Z3202 Encounter for pregnancy test, result negative: Secondary | ICD-10-CM

## 2017-12-28 DIAGNOSIS — Z3042 Encounter for surveillance of injectable contraceptive: Secondary | ICD-10-CM

## 2017-12-28 LAB — POCT URINE PREGNANCY: Preg Test, Ur: NEGATIVE

## 2017-12-28 NOTE — Patient Instructions (Signed)

## 2017-12-28 NOTE — Progress Notes (Signed)
Agree with A & P. 

## 2017-12-28 NOTE — Progress Notes (Signed)
Pt presents for Depo restart. Last IC 3 wks ago per pt. UPT neg. Initiated Depo protocol; however, pt does not [facility] to wait [facility]ks [facility] and [facility] to get an [facility] today. Consulted with provider. Pt to schedule an appt to an IUD. Information for IUD given and pt is aware no IC until IUD is placed.

## 2018-01-06 ENCOUNTER — Ambulatory Visit: Payer: Medicaid Other | Admitting: Certified Nurse Midwife

## 2018-01-09 ENCOUNTER — Telehealth: Payer: Self-pay | Admitting: *Deleted

## 2018-01-09 NOTE — Telephone Encounter (Signed)
Spoke with Patient to see if she still wishes to get IUD and patient stated she does want some form of birth control, but not sure if she if still wants IUD maybe wants Nexplanon will call back to schedule.Marland Kitchen..Marland Kitchen

## 2018-09-12 IMAGING — US US MFM FETAL BPP W/O NON-STRESS
1 series · 12 of 28 positions shown · non-contrast
Comparison: none

[Series 1: us mfm fetal bpp w/o non-stress · 32 acquisitions, 12 frames shown]
[im 2/32]
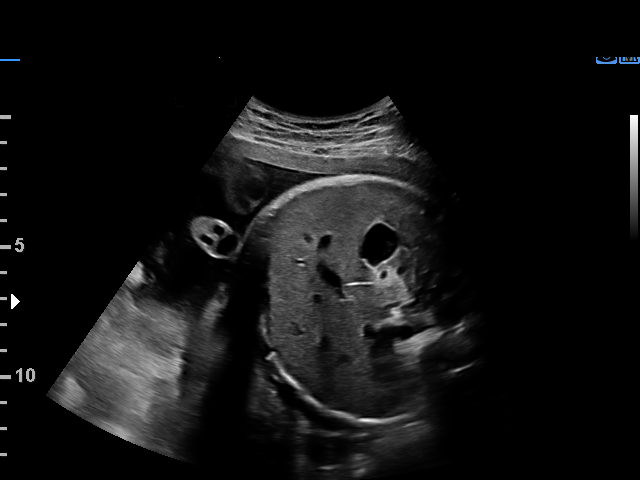
[im 4/32]
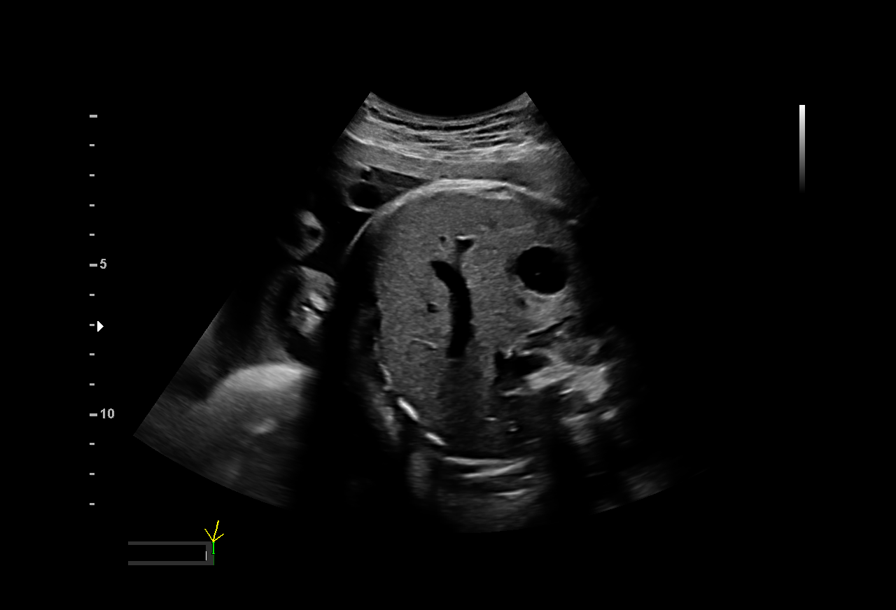
[im 6/32]
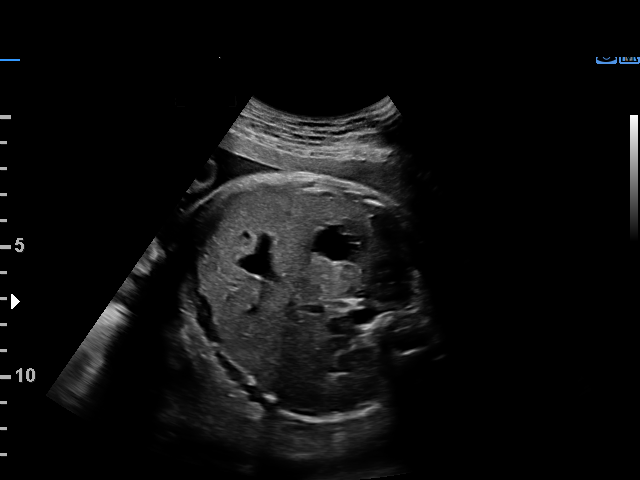
[im 10/32]
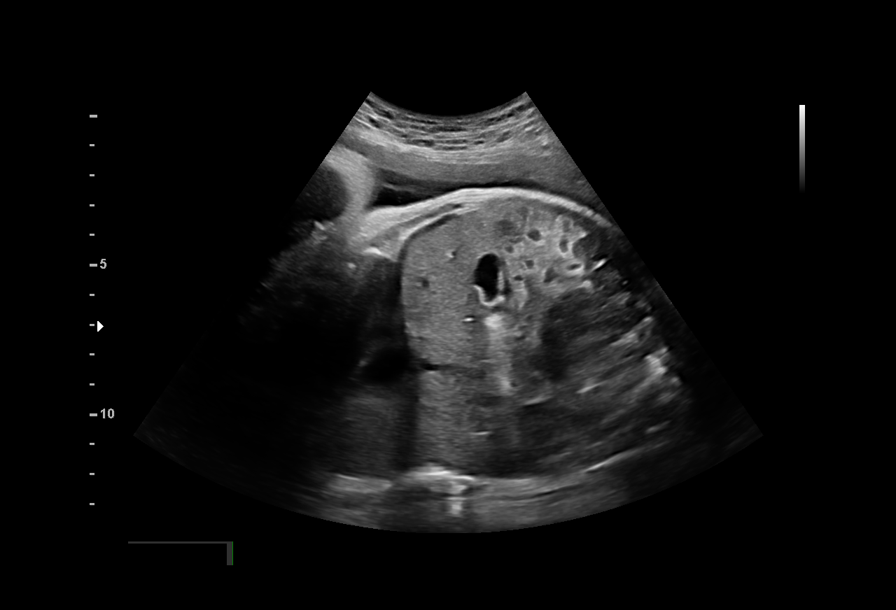
[im 12/32]
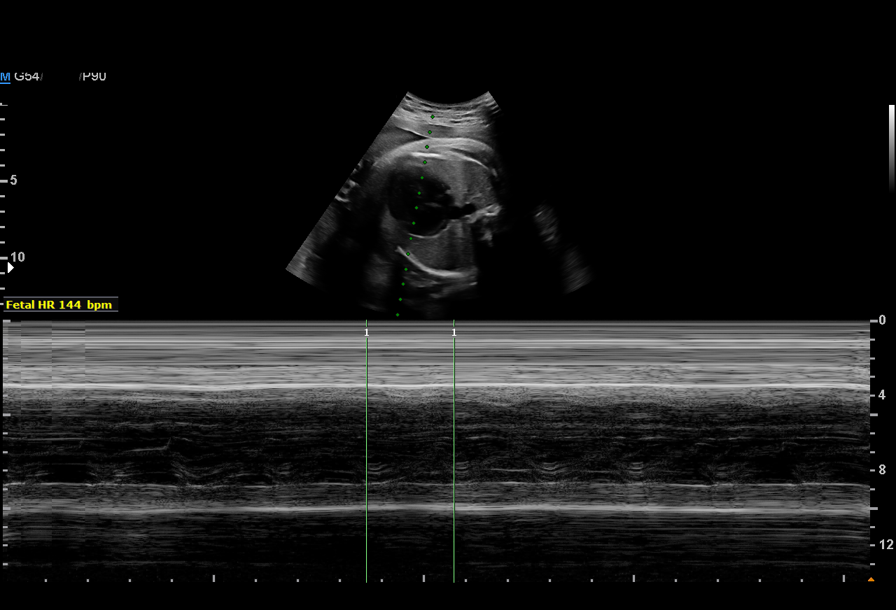
[im 14/32]
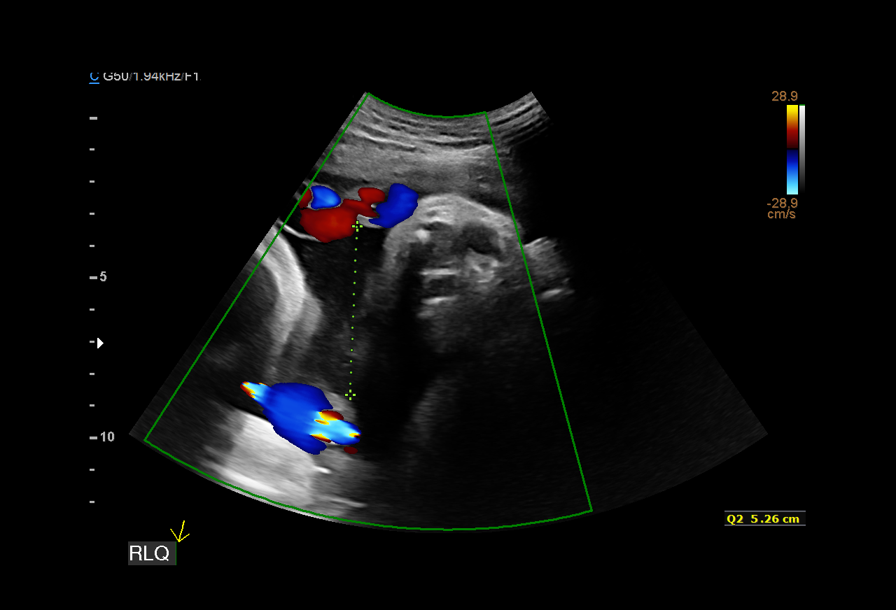
[im 18/32]
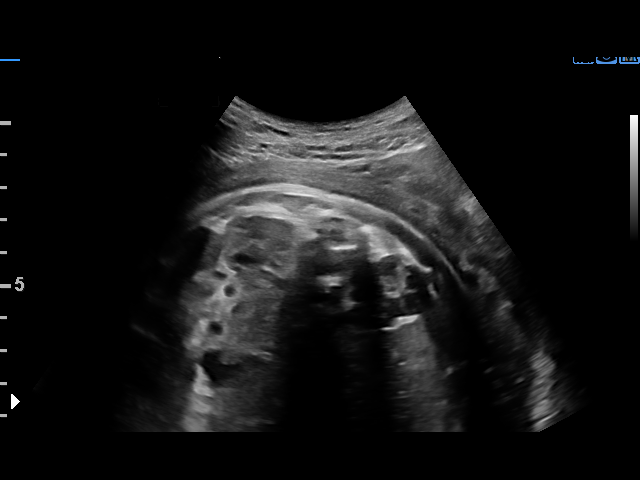
[im 20/32]
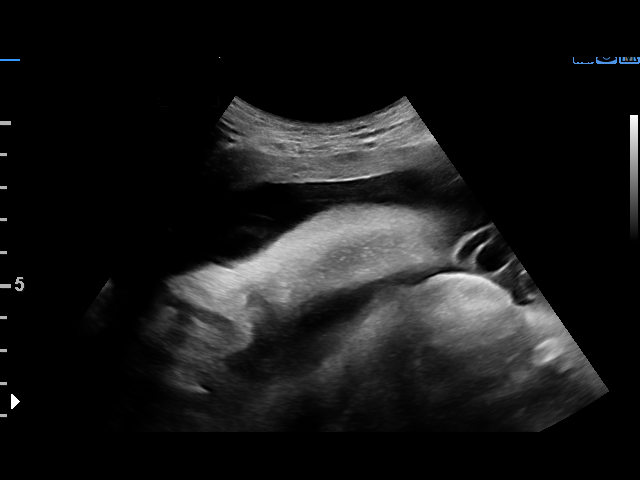
[im 22/32]
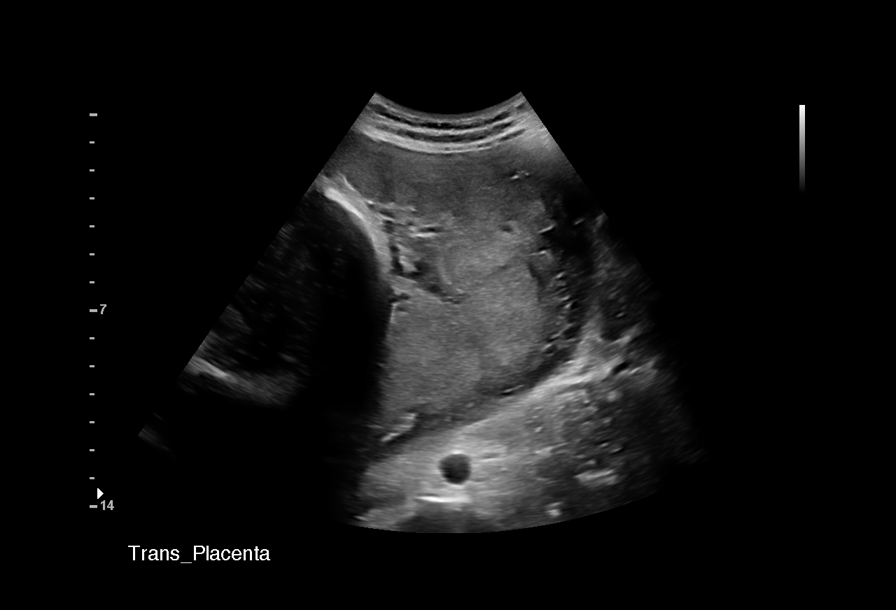
[im 26/32]
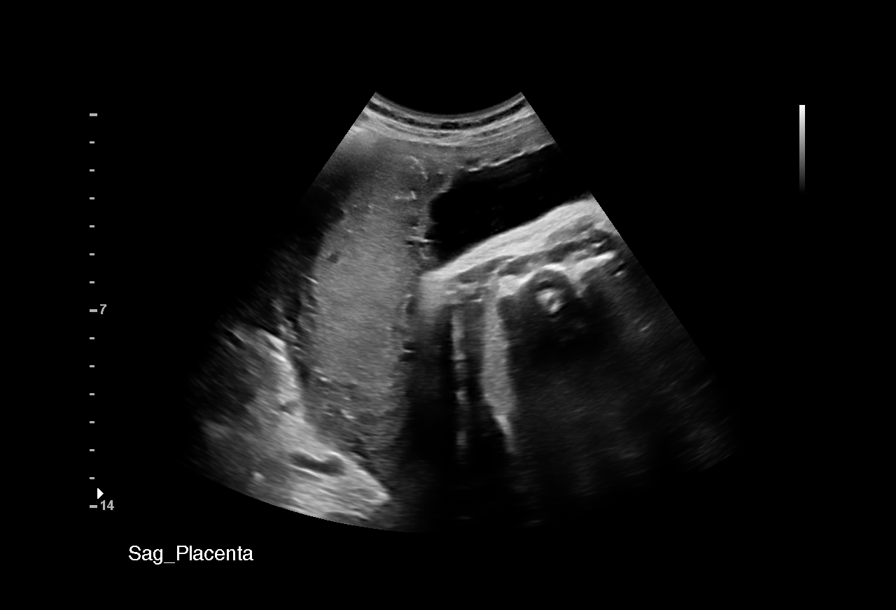
[im 28/32]
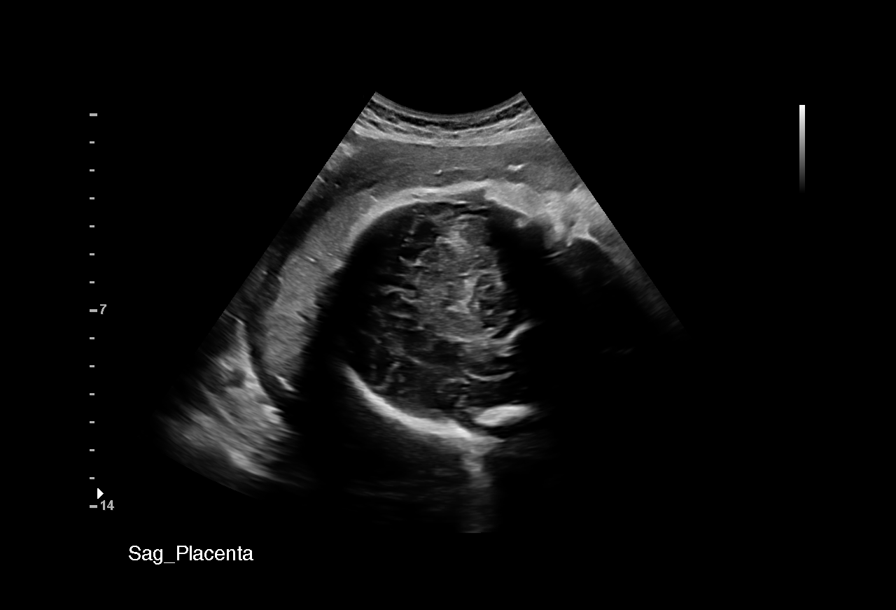
[im 30/32]
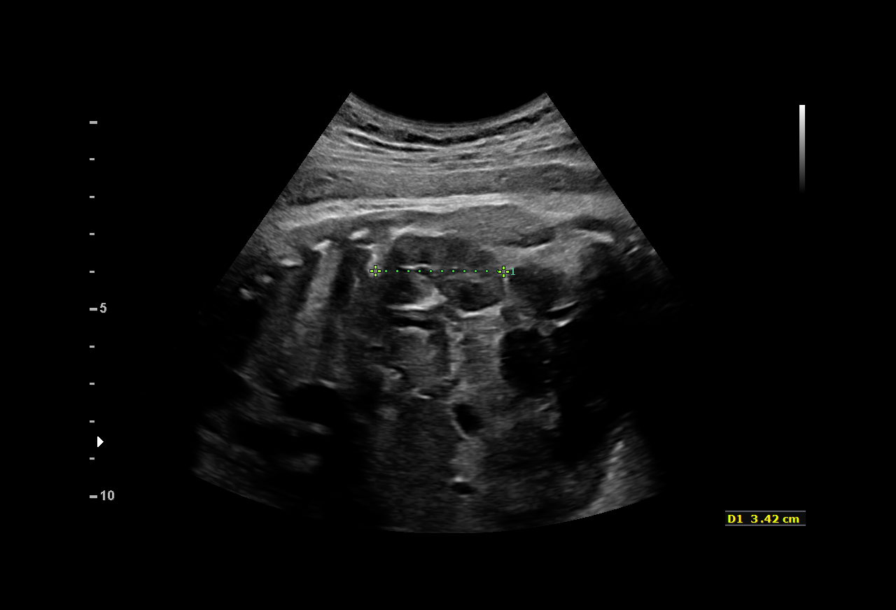

[12 of 28 positions shown; findings below may reference images not displayed]

1  NYA JUMPER           [id number]      [id number]     [id number]
Indications

38 weeks gestation of pregnancy
Fetal choroid plexus cyst
Non-reactive NST
OB History

Gravidity:    3         Term:   2        Prem:   0        SAB:   0
TOP:          0       Ectopic:  0        Living: 2
Fetal Evaluation

Num Of Fetuses:     1
Fetal Heart         144
Rate(bpm):
Cardiac Activity:   Observed
Presentation:       Breech
Placenta:           Posterior, above cervical os

Amniotic Fluid
AFI FV:      Subjectively within normal limits

AFI Sum(cm)     %Tile       Largest Pocket(cm)
15.22           60

RUQ(cm)       RLQ(cm)       LUQ(cm)        LLQ(cm)
3.14
Biophysical Evaluation

Amniotic F.V:   Within normal limits       F. Tone:        Observed
F. Movement:    Observed                   Score:          [DATE]
F. Breathing:   Observed
Gestational Age

LMP:           37w 3d       Date:   03/29/16                 EDD:   01/03/17
Best:          38w 4d    Det. By:   Early Ultrasound         EDD:   12/26/16
(06/10/16)
Impression

Singleton intrauterine pregnancy at 38 weeks 4 days
gestation with fetal cardiac activity
Breech presentation
BPP [DATE] with an AFI > 15 cm
Recommendations

Follow-up ultrasounds as clinically indicated.

## 2020-04-25 ENCOUNTER — Ambulatory Visit: Payer: HRSA Program | Attending: Internal Medicine

## 2020-04-25 DIAGNOSIS — Z20822 Contact with and (suspected) exposure to covid-19: Secondary | ICD-10-CM | POA: Diagnosis present

## 2020-04-26 LAB — NOVEL CORONAVIRUS, NAA: SARS-CoV-2, NAA: NOT DETECTED

## 2020-04-26 LAB — SARS-COV-2, NAA 2 DAY TAT

## 2020-08-05 ENCOUNTER — Other Ambulatory Visit: Payer: Self-pay

## 2020-08-05 DIAGNOSIS — Z20822 Contact with and (suspected) exposure to covid-19: Secondary | ICD-10-CM

## 2020-08-06 LAB — NOVEL CORONAVIRUS, NAA: SARS-CoV-2, NAA: NOT DETECTED

## 2020-08-06 LAB — SARS-COV-2, NAA 2 DAY TAT

## 2021-03-16 ENCOUNTER — Ambulatory Visit: Payer: Self-pay | Attending: Critical Care Medicine

## 2021-03-16 DIAGNOSIS — Z20822 Contact with and (suspected) exposure to covid-19: Secondary | ICD-10-CM | POA: Insufficient documentation

## 2021-03-17 ENCOUNTER — Telehealth: Payer: Self-pay

## 2021-03-17 LAB — NOVEL CORONAVIRUS, NAA: SARS-CoV-2, NAA: NOT DETECTED

## 2021-03-17 LAB — SARS-COV-2, NAA 2 DAY TAT

## 2021-03-17 NOTE — Telephone Encounter (Signed)
Called and informed patient that test for Covid 19 was NEGATIVE. Discussed signs and symptoms of Covid 19 : fever, chills, respiratory symptoms, cough, ENT symptoms, sore throat, SOB, muscle pain, diarrhea, headache, loss of taste/smell, close exposure to COVID-19 patient. Pt instructed to call PCP if they develop the above signs and sx. Pt also instructed to call 911 if having respiratory issues/distress. Discussed MyChart enrollment. Pt verbalized understanding. Spoke with her brother is on [place])

## 2023-02-08 ENCOUNTER — Ambulatory Visit (HOSPITAL_COMMUNITY)
Admission: EM | Admit: 2023-02-08 | Discharge: 2023-02-08 | Disposition: A | Discharge status: Home / Self Care | Payer: BLUE CROSS/BLUE SHIELD | Attending: Emergency Medicine | Admitting: Emergency Medicine

## 2023-02-08 ENCOUNTER — Encounter (HOSPITAL_COMMUNITY): Payer: Self-pay

## 2023-02-08 DIAGNOSIS — J358 Other chronic diseases of tonsils and adenoids: Secondary | ICD-10-CM | POA: Insufficient documentation

## 2023-02-08 DIAGNOSIS — M25561 Pain in right knee: Secondary | ICD-10-CM | POA: Diagnosis not present

## 2023-02-08 DIAGNOSIS — M25562 Pain in left knee: Secondary | ICD-10-CM | POA: Insufficient documentation

## 2023-02-08 DIAGNOSIS — Z113 Encounter for screening for infections with a predominantly sexual mode of transmission: Secondary | ICD-10-CM | POA: Diagnosis not present

## 2023-02-08 DIAGNOSIS — G8929 Other chronic pain: Secondary | ICD-10-CM | POA: Insufficient documentation

## 2023-02-08 LAB — HIV ANTIBODY (ROUTINE TESTING W REFLEX): HIV Screen 4th Generation wRfx: NONREACTIVE

## 2023-02-08 LAB — RPR: RPR Ser Ql: NONREACTIVE

## 2023-02-08 MED ORDER — ACETAMINOPHEN 500 MG PO TABS: 500.0000 mg | ORAL_TABLET | Freq: Four times a day (QID) | ORAL | 0 refills | Status: AC | PRN

## 2023-02-08 NOTE — ED Provider Notes (Signed)
[facility]    CSN: [id number] Arrival date & time: 02/08/23  2130      History   Chief Complaint Chief Complaint  Patient presents with   Knee Pain    HPI Michele Everett is a 42 y.o. female.   Patient presents to the clinic for multiple complaints.  She has been having bilateral knee pain, notices that this is worse when it gets cold.  She is ambulatory in clinic.  Denies any falls, injuries, or swelling.  Will sometimes take Tylenol and it helps.  She has been coughing up little stones that appear to be from the back of her tonsils.  Denies sore throat, fever, wheezing, shortness of breath or recent illness.  She also reports an abnormal color to her vaginal discharge and would like screening for sexually transmitted infections.  Reports it has been a while since she was last screened.  Denies any vaginal sores, dysuria, fevers or flank pain.     The history is provided by the patient and medical records.  Knee Pain Associated symptoms: no fatigue     Past Medical History:  Diagnosis Date   Medical history non-contributory     Patient Active Problem List   Diagnosis Date Noted   Breech presentation on examination 12/17/2016   Choroid plexus cyst of fetus affecting care of mother, antepartum 08/11/2016    Past Surgical History:  Procedure Laterality Date   NO PAST SURGERIES     THERAPEUTIC ABORTION      OB History     Gravida  4   Para  3   Term  3   Preterm      AB  1   Living  3      SAB      IAB  1   Ectopic      Multiple  0   Live Births  3            Home Medications    Prior to Admission medications   Medication Sig Start Date End Date Taking? Authorizing Provider  acetaminophen (TYLENOL) 500 MG tablet Take 1 tablet (500 mg total) by mouth every 6 (six) hours as needed. 02/08/23  Yes Rinaldo Ratel, [place] N, FNP  medroxyPROGESTERone (DEPO-PROVERA) 150 MG/ML injection Inject 1 mL (150 mg total) into the muscle every  3 (three) months. 09/07/17   Orvilla Cornwall A, CNM  metroNIDAZOLE (FLAGYL) 500 MG tablet Take 1 tablet (500 mg total) by mouth 2 (two) times daily. 09/09/17   Denney, Boykin Reaper A, CNM  Prenatal Vit-Fe Fum-Fe Bisg-FA (NATACHEW) 28-1 MG CHEW Chew 1 tablet by mouth daily. 09/07/17   Roe Coombs, CNM    Family History Family History  Problem Relation Age of Onset   Cancer Paternal Grandfather    Hypertension Paternal Grandfather    Asthma Paternal Grandfather    Diabetes Paternal Grandfather    Heart disease Paternal Grandfather    Hearing loss Paternal Grandfather    Stroke Paternal Grandfather    Alcohol abuse Neg Hx    Arthritis Neg Hx    Birth defects Neg Hx    COPD Neg Hx    Depression Neg Hx    Drug abuse Neg Hx    Early death Neg Hx    Hyperlipidemia Neg Hx    Kidney disease Neg Hx    Learning disabilities Neg Hx    Mental illness Neg Hx    Mental retardation Neg Hx    Miscarriages / [place]  Neg Hx    Vision loss Neg Hx    Varicose Veins Neg Hx     Social History Social History   Tobacco Use   Smoking status: Never   Smokeless tobacco: Never  Substance Use Topics   Alcohol use: No   Drug use: No     Allergies   Patient has no known allergies.   Review of Systems Review of Systems  Constitutional:  Negative for chills and fatigue.  HENT:  Negative for sore throat.   Respiratory:  Negative for cough and shortness of breath.   Cardiovascular:  Negative for chest pain.  Gastrointestinal:  Negative for abdominal pain.  Genitourinary:  Positive for vaginal discharge.  Musculoskeletal:  Positive for arthralgias. Negative for gait problem and joint swelling.     Physical Exam Triage Vital Signs ED Triage Vitals  Enc Vitals Group     BP 02/08/23 1025 117/60     Pulse Rate 02/08/23 1023 80     Resp 02/08/23 1023 16     Temp 02/08/23 1023 98.3 F (36.8 C)     Temp Source 02/08/23 1023 Oral     SpO2 02/08/23 1023 98 %     Weight --      Height  --      Head Circumference --      Peak Flow --      Pain Score 02/08/23 1025 4     Pain Loc --      Pain Edu? --      Excl. in GC? --    No data found.  Updated Vital Signs BP 117/60 (BP Location: Left Arm)   Pulse 80   Temp 98.3 F (36.8 C) (Oral)   Resp 16   LMP 02/06/2023 (Exact Date)   SpO2 98%   Visual Acuity Right Eye Distance:   Left Eye Distance:   Bilateral Distance:    Right Eye Near:   Left Eye Near:    Bilateral Near:     Physical Exam Vitals and nursing note reviewed.  Constitutional:      Appearance: Normal appearance.  HENT:     Head: Normocephalic and atraumatic.     Right Ear: External ear normal.     Left Ear: External ear normal.     Nose: Nose normal.     Mouth/Throat:     Mouth: Mucous membranes are moist.     Pharynx: Posterior oropharyngeal erythema present.  Eyes:     General: No scleral icterus. Cardiovascular:     Rate and Rhythm: Normal rate and regular rhythm.     Heart sounds: Normal heart sounds. No murmur heard. Pulmonary:     Effort: Pulmonary effort is normal. No respiratory distress.     Breath sounds: Normal breath sounds.  Musculoskeletal:        General: No swelling, tenderness, deformity or signs of injury. Normal range of motion.  Skin:    General: Skin is warm and dry.     Capillary Refill: Capillary refill takes less than 2 seconds.  Neurological:     General: No focal deficit present.     Mental Status: She is alert and oriented to person, place, and time.  Psychiatric:        Mood and Affect: Mood normal.        Behavior: Behavior normal.      UC Treatments / Results  Labs (all labs ordered are listed, but only abnormal results are displayed) Labs Reviewed  HIV  ANTIBODY (ROUTINE TESTING W REFLEX)  RPR  CERVICOVAGINAL ANCILLARY ONLY    EKG   Radiology No results found.  Procedures Procedures (including critical care time)  Medications Ordered in UC Medications - No data to display  Initial  Impression / Assessment and Plan / UC Course  I have reviewed the triage vital signs and the nursing notes.  Pertinent labs & imaging results that were available during my care of the patient were reviewed by me and considered in my medical decision making (see chart for details).  Vitals and triage reviewed, patient is hemodynamically stable.  Range of motion bilateral knees intact, without swelling or tenderness to palpation.  Suspect arthritis, will trial Tylenol and advised given health sports medicine follow-up.  Will defer imaging due to lack of trauma or deformity.  Ambulatory.  Patient has a picture of round pieces taken from the back of her throat, appear to be tonsil stones.  Advised to use a Waterpik and proper oral hygiene.  Without sore throat, lymph node swelling, recent illness or fever.  Patient is also concerned over a greenish-yellow discharge that she has been having.  Would like STI screening.  Will contact if anything is positive on her cytology to initiate the appropriate treatment.  Follow-up care reviewed, no questions at this time.     Final Clinical Impressions(s) / UC Diagnoses   Final diagnoses:  Chronic pain of both knees  Tonsillolith  Screening examination for sexually transmitted disease     Discharge Instructions      You can take the Tylenol arthritis every 6 hours as needed for the pain in your knees.  I advise following up with [facility] for further evaluation, I suspect you have arthritis.  You appear to have tonsil stones, you can use a Waterpik to help manually dislodge these.  You can also do warm saline gargles.  Please ensure you are brushing your teeth after meals, flossing and using mouthwash.    For your abnormal vaginal discharge we have screened you for sexually transmitted infections as well as bacterial vaginosis and yeast.  We will contact you if anything is abnormal and initiate the appropriate treatment.  I suggest  following up with a primary care provider for further management of your symptoms, you can go online to schedule at your convenience, information to obtain a primary care provider is attached within your discharge papers.  Please return to the clinic for any new or concerning symptoms.      ED Prescriptions     Medication Sig Dispense Auth. Provider   acetaminophen (TYLENOL) 500 MG tablet Take 1 tablet (500 mg total) by mouth every 6 (six) hours as needed. 30 tablet Zylee Marchiano, [place] N, [place]      PDMP not reviewed this encounter.   Haidar Muse, [place] N, [place] 02/08/23 1106

## 2023-02-08 NOTE — ED Triage Notes (Signed)
Pt states bilateral knee pain for the past 2 years.  States it is worse when it is cold. Denies injury.  Pt also says she coughs up white phlegm that smells bad for the past 5 years.

## 2023-02-08 NOTE — Discharge Instructions (Addendum)
You can take the Tylenol arthritis every 6 hours as needed for the pain in your knees.  I advise following up with [facility] for further evaluation, I suspect you have arthritis.  You appear to have tonsil stones, you can use a Waterpik to help manually dislodge these.  You can also do warm saline gargles.  Please ensure you are brushing your teeth after meals, flossing and using mouthwash.    For your abnormal vaginal discharge we have screened you for sexually transmitted infections as well as bacterial vaginosis and yeast.  We will contact you if anything is abnormal and initiate the appropriate treatment.  I suggest following up with a primary care provider for further management of your symptoms, you can go online to schedule at your convenience, information to obtain a primary care provider is attached within your discharge papers.  Please return to the clinic for any new or concerning symptoms.

## 2023-02-09 ENCOUNTER — Telehealth (HOSPITAL_COMMUNITY): Payer: Self-pay

## 2023-02-09 LAB — CERVICOVAGINAL ANCILLARY ONLY
Bacterial Vaginitis (gardnerella): NEGATIVE
Candida Glabrata: NEGATIVE
Candida Vaginitis: POSITIVE — AB
Chlamydia: NEGATIVE
Comment: NEGATIVE
Comment: NEGATIVE
Comment: NEGATIVE
Comment: NEGATIVE
Comment: NEGATIVE
Comment: NORMAL
Neisseria Gonorrhea: NEGATIVE
Trichomonas: NEGATIVE

## 2023-02-09 MED ORDER — FLUCONAZOLE 150 MG PO TABS: 150.0000 mg | ORAL_TABLET | Freq: Every day | ORAL | 0 refills | Status: AC
